# Patient Record
Sex: Female | Born: 1995 | Race: Black or African American | Hispanic: No | Marital: Single | Smoking: Former smoker
Health system: Southern US, Community
[De-identification: ages and names within clinical notes are randomized; demographics above are authoritative.]

## PROBLEM LIST (undated history)

## (undated) DIAGNOSIS — J45909 Unspecified asthma, uncomplicated: Secondary | ICD-10-CM

## (undated) DIAGNOSIS — K2970 Gastritis, unspecified, without bleeding: Secondary | ICD-10-CM

## (undated) DIAGNOSIS — R569 Unspecified convulsions: Secondary | ICD-10-CM

## (undated) DIAGNOSIS — K219 Gastro-esophageal reflux disease without esophagitis: Secondary | ICD-10-CM

## (undated) DIAGNOSIS — J302 Other seasonal allergic rhinitis: Secondary | ICD-10-CM

## (undated) DIAGNOSIS — O24419 Gestational diabetes mellitus in pregnancy, unspecified control: Secondary | ICD-10-CM

## (undated) DIAGNOSIS — D649 Anemia, unspecified: Secondary | ICD-10-CM

## (undated) HISTORY — PX: NO PAST SURGERIES: SHX2092

## (undated) HISTORY — DX: Gastritis, unspecified, without bleeding: K29.70

## (undated) HISTORY — DX: Other seasonal allergic rhinitis: J30.2

## (undated) MED FILL — Iron Sucrose Inj 20 MG/ML (Fe Equiv): INTRAVENOUS | Qty: 10 | Status: AC

---
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
## 2019-10-05 ENCOUNTER — Other Ambulatory Visit: Payer: Self-pay

## 2019-10-05 ENCOUNTER — Emergency Department: Payer: Self-pay

## 2019-10-05 ENCOUNTER — Emergency Department
Admission: EM | Admit: 2019-10-05 | Discharge: 2019-10-05 | Disposition: A | Discharge status: Home / Self Care | Payer: Self-pay | Attending: Student in an Organized Health Care Education/Training Program | Admitting: Student in an Organized Health Care Education/Training Program

## 2019-10-05 DIAGNOSIS — Z79899 Other long term (current) drug therapy: Secondary | ICD-10-CM | POA: Insufficient documentation

## 2019-10-05 DIAGNOSIS — R569 Unspecified convulsions: Secondary | ICD-10-CM | POA: Insufficient documentation

## 2019-10-05 DIAGNOSIS — F17210 Nicotine dependence, cigarettes, uncomplicated: Secondary | ICD-10-CM | POA: Insufficient documentation

## 2019-10-05 DIAGNOSIS — J45909 Unspecified asthma, uncomplicated: Secondary | ICD-10-CM | POA: Insufficient documentation

## 2019-10-05 HISTORY — DX: Unspecified convulsions: R56.9

## 2019-10-05 HISTORY — DX: Unspecified asthma, uncomplicated: J45.909

## 2019-10-05 LAB — CBC WITH DIFFERENTIAL/PLATELET
Abs Immature Granulocytes: 0.03 10*3/uL (ref 0.00–0.07)
Basophils Absolute: 0 10*3/uL (ref 0.0–0.1)
Basophils Relative: 0 %
Eosinophils Absolute: 0 10*3/uL (ref 0.0–0.5)
Eosinophils Relative: 0 %
HCT: 34.8 % — ABNORMAL LOW (ref 36.0–46.0)
Hemoglobin: 10.5 g/dL — ABNORMAL LOW (ref 12.0–15.0)
Immature Granulocytes: 0 %
Lymphocytes Relative: 27 %
Lymphs Abs: 2.6 10*3/uL (ref 0.7–4.0)
MCH: 22.8 pg — ABNORMAL LOW (ref 26.0–34.0)
MCHC: 30.2 g/dL (ref 30.0–36.0)
MCV: 75.5 fL — ABNORMAL LOW (ref 80.0–100.0)
Monocytes Absolute: 0.4 10*3/uL (ref 0.1–1.0)
Monocytes Relative: 4 %
Neutro Abs: 6.6 10*3/uL (ref 1.7–7.7)
Neutrophils Relative %: 69 %
Platelets: 300 10*3/uL (ref 150–400)
RBC: 4.61 MIL/uL (ref 3.87–5.11)
RDW: 15.1 % (ref 11.5–15.5)
WBC: 9.6 10*3/uL (ref 4.0–10.5)
nRBC: 0 % (ref 0.0–0.2)

## 2019-10-05 LAB — URINE DRUG SCREEN, QUALITATIVE (ARMC ONLY)
Amphetamines, Ur Screen: NOT DETECTED
Barbiturates, Ur Screen: NOT DETECTED
Benzodiazepine, Ur Scrn: NOT DETECTED
Cannabinoid 50 Ng, Ur ~~LOC~~: NOT DETECTED
Cocaine Metabolite,Ur ~~LOC~~: NOT DETECTED
MDMA (Ecstasy)Ur Screen: NOT DETECTED
Methadone Scn, Ur: NOT DETECTED
Opiate, Ur Screen: NOT DETECTED
Phencyclidine (PCP) Ur S: NOT DETECTED
Tricyclic, Ur Screen: NOT DETECTED

## 2019-10-05 LAB — COMPREHENSIVE METABOLIC PANEL
ALT: 12 U/L (ref 0–44)
AST: 22 U/L (ref 15–41)
Albumin: 3.6 g/dL (ref 3.5–5.0)
Alkaline Phosphatase: 67 U/L (ref 38–126)
Anion gap: 12 (ref 5–15)
BUN: 7 mg/dL (ref 6–20)
CO2: 20 mmol/L — ABNORMAL LOW (ref 22–32)
Calcium: 8.5 mg/dL — ABNORMAL LOW (ref 8.9–10.3)
Chloride: 104 mmol/L (ref 98–111)
Creatinine, Ser: 0.71 mg/dL (ref 0.44–1.00)
GFR calc Af Amer: >60 mL/min (ref >60)
GFR calc non Af Amer: >60 mL/min (ref >60)
Glucose, Bld: 127 mg/dL — ABNORMAL HIGH (ref 70–99)
Potassium: 3.6 mmol/L (ref 3.5–5.1)
Sodium: 136 mmol/L (ref 135–145)
Total Bilirubin: 0.4 mg/dL (ref 0.3–1.2)
Total Protein: 7.2 g/dL (ref 6.5–8.1)

## 2019-10-05 LAB — POCT PREGNANCY, URINE: Preg Test, Ur: NEGATIVE

## 2019-10-05 MED ORDER — ACETAMINOPHEN 500 MG PO TABS
1000.0000 mg | ORAL_TABLET | Freq: Once | ORAL | Status: AC
  Administered 2019-10-05: 1000 mg via ORAL
  Filled 2019-10-05: qty 2

## 2019-10-05 NOTE — ED Provider Notes (Signed)
[facility] Emergency Department Provider Note    First MD Initiated Contact with Patient 10/05/19 0032     (approximate)  I have reviewed the triage vital signs and the nursing notes.   HISTORY  Chief Complaint Seizures    HPI Carmen Rivers is a 24 y.o. female the time a history of seizure but not on any antiepileptic medications presents to the ER after witnessed seizure episode this evening.  States this occurred while she was asleep.  States the last thing she remembers was "doing it "with her friend in the house.  Denies any chest pain or shortness of breath.  Does have a mild headache.  Not currently on anticoagulation.  Denies any nausea or vomiting.  No blurry vision.  Denies any trauma.    Past Medical History:  Diagnosis Date  . Anemia   . Asthma   . Reactive airway disease   . Seizures (HCC)    No family history on file. Past Surgical History:  Procedure Laterality Date  . NO PAST SURGERIES     Patient Active Problem List   Diagnosis Date Noted  . Anemia 09/30/2015  . Menorrhagia 09/30/2015  . Pulmonary embolism on right (HCC) 09/21/2015  . UTI (lower urinary tract infection) 09/21/2015  . Pulmonary embolus, right (HCC) 09/21/2015      Prior to Admission medications   Medication Sig Start Date End Date Taking? Authorizing Provider  acetaminophen (TYLENOL) 325 MG tablet Take 2 tablets (650 mg total) by mouth every 6 (six) hours as needed for mild pain (or Fever >/= 101). 09/24/15   Gouru, Deanna Artis, MD  albuterol (PROVENTIL HFA;VENTOLIN HFA) 108 (90 Base) MCG/ACT inhaler Inhale 2 puffs into the lungs every 6 (six) hours as needed for wheezing or shortness of breath. 09/24/15   Gouru, Deanna Artis, MD  cephALEXin (KEFLEX) 500 MG capsule Take 1 capsule (500 mg total) by mouth 2 (two) times daily. Patient not taking: Reported on 09/30/2015 09/21/15   Gayla Doss, MD  docusate sodium (COLACE) 100 MG capsule Take 1 capsule (100 mg total) by mouth 2 (two)  times daily. 09/24/15   Ramonita Lab, MD  ferrous sulfate 325 (65 FE) MG tablet Take 1 tablet (325 mg total) by mouth 2 (two) times daily with a meal. 09/24/15   Gouru, Deanna Artis, MD  ibuprofen (ADVIL,MOTRIN) 600 MG tablet Take 1 tablet (600 mg total) by mouth every 6 (six) hours as needed for moderate pain. Patient not taking: Reported on 09/30/2015 09/21/15   Gayla Doss, MD  oxyCODONE (ROXICODONE) 5 MG immediate release tablet Take 1 tablet (5 mg total) by mouth every 6 (six) hours as needed for breakthrough pain. Do not drive while taking this medication. Take this medication only if you're having pain despite taking ibuprofen as prescribed. 09/24/15   Ramonita Lab, MD  Rivaroxaban (XARELTO) 15 MG TABS tablet Take 1 tablet (15 mg total) by mouth 2 (two) times daily with a meal. 09/24/15 10/14/15  Ramonita Lab, MD    Allergies Patient has no known allergies.    Social History Social History   Tobacco Use  . Smoking status: Current Every Day Smoker  . Smokeless tobacco: Never Used  Substance Use Topics  . Alcohol use: No  . Drug use: Yes    Types: Marijuana    Review of Systems Patient denies headaches, rhinorrhea, blurry vision, numbness, shortness of breath, chest pain, edema, cough, abdominal pain, nausea, vomiting, diarrhea, dysuria, fevers, rashes or hallucinations unless otherwise stated  above in HPI. ____________________________________________   PHYSICAL EXAM:  VITAL SIGNS: Vitals:   10/05/19 0600 10/05/19 0630  BP: (!) 106/54 112/61  Pulse: 70 70  Resp: 18 19  Temp:    SpO2: 100% 98%    Constitutional: Alert and oriented.  Eyes: Conjunctivae are normal.  Head: Atraumatic. Nose: No congestion/rhinnorhea. Mouth/Throat: Mucous membranes are moist.   Neck: No stridor. Painless ROM.  Cardiovascular: Normal rate, regular rhythm. Grossly normal heart sounds.  Good peripheral circulation. Respiratory: Normal respiratory effort.  No retractions. Lungs CTAB. Gastrointestinal:  Soft and nontender. No distention. No abdominal bruits. No CVA tenderness. Genitourinary:  Musculoskeletal: No lower extremity tenderness nor edema.  No joint effusions. Neurologic:  Normal speech and language. No gross focal neurologic deficits are appreciated. No facial droop Skin:  Skin is warm, dry and intact. No rash noted. Psychiatric: Mood and affect are normal. Speech and behavior are normal.  ____________________________________________   LABS (all labs ordered are listed, but only abnormal results are displayed)  Results for orders placed or performed during the hospital encounter of 10/05/19 (from the past 24 hour(s))  CBC with Differential/Platelet     Status: Abnormal   Collection Time: 10/05/19 12:47 AM  Result Value Ref Range   WBC 9.6 4.0 - 10.5 K/uL   RBC 4.61 3.87 - 5.11 MIL/uL   Hemoglobin 10.5 (L) 12.0 - 15.0 g/dL   HCT 34.8 (L) 36.0 - 46.0 %   MCV 75.5 (L) 80.0 - 100.0 fL   MCH 22.8 (L) 26.0 - 34.0 pg   MCHC 30.2 30.0 - 36.0 g/dL   RDW 15.1 11.5 - 15.5 %   Platelets 300 150 - 400 K/uL   nRBC 0.0 0.0 - 0.2 %   Neutrophils Relative % 69 %   Neutro Abs 6.6 1.7 - 7.7 K/uL   Lymphocytes Relative 27 %   Lymphs Abs 2.6 0.7 - 4.0 K/uL   Monocytes Relative 4 %   Monocytes Absolute 0.4 0.1 - 1.0 K/uL   Eosinophils Relative 0 %   Eosinophils Absolute 0.0 0.0 - 0.5 K/uL   Basophils Relative 0 %   Basophils Absolute 0.0 0.0 - 0.1 K/uL   Immature Granulocytes 0 %   Abs Immature Granulocytes 0.03 0.00 - 0.07 K/uL  Comprehensive metabolic panel     Status: Abnormal   Collection Time: 10/05/19 12:47 AM  Result Value Ref Range   Sodium 136 135 - 145 mmol/L   Potassium 3.6 3.5 - 5.1 mmol/L   Chloride 104 98 - 111 mmol/L   CO2 20 (L) 22 - 32 mmol/L   Glucose, Bld 127 (H) 70 - 99 mg/dL   BUN 7 6 - 20 mg/dL   Creatinine, Ser 0.71 0.44 - 1.00 mg/dL   Calcium 8.5 (L) 8.9 - 10.3 mg/dL   Total Protein 7.2 6.5 - 8.1 g/dL   Albumin 3.6 3.5 - 5.0 g/dL   AST 22 15 - 41 U/L     ALT 12 0 - 44 U/L   Alkaline Phosphatase 67 38 - 126 U/L   Total Bilirubin 0.4 0.3 - 1.2 mg/dL   GFR calc non Af Amer >60 >60 mL/min   GFR calc Af Amer >60 >60 mL/min   Anion gap 12 5 - 15  Urine Drug Screen, Qualitative ([facility] only)     Status: None   Collection Time: 10/05/19  3:24 AM  Result Value Ref Range   Tricyclic, Ur Screen NONE DETECTED NONE DETECTED   Amphetamines, Ur Screen NONE  DETECTED NONE DETECTED   MDMA (Ecstasy)Ur Screen NONE DETECTED NONE DETECTED   Cocaine Metabolite,Ur [place] NONE DETECTED NONE DETECTED   Opiate, Ur Screen NONE DETECTED NONE DETECTED   Phencyclidine (PCP) Ur S NONE DETECTED NONE DETECTED   Cannabinoid 50 Ng, Ur [place] NONE DETECTED NONE DETECTED   Barbiturates, Ur Screen NONE DETECTED NONE DETECTED   Benzodiazepine, Ur Scrn NONE DETECTED NONE DETECTED   Methadone Scn, Ur NONE DETECTED NONE DETECTED  Pregnancy, urine POC     Status: None   Collection Time: 10/05/19  3:43 AM  Result Value Ref Range   Preg Test, Ur NEGATIVE NEGATIVE   ____________________________________________  EKG My review and personal interpretation at Time: 0:50   Indication: seizure lke activity  Rate: 90  Rhythm: sinus Axis: normal Other: normal intervals, no stemi ____________________________________________  RADIOLOGY  I personally reviewed all radiographic images ordered to evaluate for the above acute complaints and reviewed radiology reports and findings.  These findings were personally discussed with the patient.  Please see medical record for radiology report.  ____________________________________________   PROCEDURES  Procedure(s) performed:  Procedures    Critical Care performed: no ____________________________________________   INITIAL IMPRESSION / ASSESSMENT AND PLAN / ED COURSE  Pertinent labs & imaging results that were available during my care of the patient were reviewed by me and considered in my medical decision making (see chart for details).    DDX: seizure, pseuoseizure, dysrhythmia, electrolyte abn,   Carmen Rivers is a 24 y.o. who presents to the ED with history and presentation as described above arrives to the ER afebrile hemodynamically stable in no acute distress.  Slightly drowsy neuro exam is intact.  Medical be sent for the above differential.  Certainly no evidence of status.  Blood work is reassuring.  No significant acidosis.  No evidence of substance abuse.  CT imaging shows no evidence of acute abnormality.  She is not on anticoagulation at this time.  She was observed in the ER for several hours with no change in mental status and no additional seizure episodes.  No signs of dysrhythmia.  Patient stable and appropriate for outpatient follow-up.     The patient was evaluated in Emergency Department today for the symptoms described in the history of present illness. He/she was evaluated in the context of the global COVID-19 pandemic, which necessitated consideration that the patient might be at risk for infection with the SARS-CoV-2 virus that causes COVID-19. Institutional protocols and algorithms that pertain to the evaluation of patients at risk for COVID-19 are in a state of rapid change based on information released by regulatory bodies including the CDC and federal and state organizations. These policies and algorithms were followed during the patient's care in the ED.  As part of my medical decision making, I reviewed the following data within the electronic MEDICAL RECORD NUMBER Nursing notes reviewed and incorporated, Labs reviewed, notes from prior ED visits and [place] Controlled Substance Database   ____________________________________________   FINAL CLINICAL IMPRESSION(S) / ED DIAGNOSES  Final diagnoses:  Seizure-like activity (HCC)      NEW MEDICATIONS STARTED DURING THIS VISIT:  Discharge Medication List as of 10/05/2019  6:32 AM       Note:  This document was prepared using Dragon voice recognition software  and may include unintentional dictation errors.    Willy Eddy, MD 10/05/19 [phone]

## 2019-10-05 NOTE — ED Triage Notes (Signed)
Pt to ED via EMS from home. Per ems pt had seizure like activity and was post ictal on arrival for EMS. Pt has hx of 1 seizure, pt was prescribed xarelto for 38mos and finished medication 1 yr ago. Pt is now a&o x4. CBG 125. Pt c/o headache

## 2019-10-05 NOTE — Discharge Instructions (Signed)
Do not drive or operate machinery until evaluated and cleared by neurology.

## 2020-07-24 ENCOUNTER — Ambulatory Visit: Payer: Self-pay

## 2020-07-24 ENCOUNTER — Ambulatory Visit: Payer: Self-pay | Admitting: Family Medicine

## 2020-07-24 ENCOUNTER — Encounter: Payer: Self-pay | Admitting: Physician Assistant

## 2020-07-24 ENCOUNTER — Other Ambulatory Visit: Payer: Self-pay

## 2020-07-24 DIAGNOSIS — N760 Acute vaginitis: Secondary | ICD-10-CM

## 2020-07-24 DIAGNOSIS — Z113 Encounter for screening for infections with a predominantly sexual mode of transmission: Secondary | ICD-10-CM

## 2020-07-24 DIAGNOSIS — B9689 Other specified bacterial agents as the cause of diseases classified elsewhere: Secondary | ICD-10-CM

## 2020-07-24 DIAGNOSIS — L739 Follicular disorder, unspecified: Secondary | ICD-10-CM

## 2020-07-24 LAB — WET PREP FOR TRICH, YEAST, CLUE
Trichomonas Exam: POSITIVE — AB
Yeast Exam: NEGATIVE

## 2020-07-24 LAB — HEPATITIS B SURFACE ANTIGEN: Hepatitis B Surface Ag: NONREACTIVE

## 2020-07-24 LAB — PREGNANCY, URINE: Preg Test, Ur: NEGATIVE

## 2020-07-24 MED ORDER — METRONIDAZOLE 500 MG PO TABS: 500.0000 mg | ORAL_TABLET | Freq: Two times a day (BID) | ORAL | 0 refills | Status: AC

## 2020-07-24 NOTE — Progress Notes (Signed)
Reports 2 menses 04/2020 and menses 05/2020 that was normal. Unable to remember if had menses in 06/2020. Per client, is allergic to some trees but does not know which ones. Encouraged to obtain allergies where testing was done. Also thinks may be allergic to almonds, but unsure. Jossie Ng, RN

## 2020-07-24 NOTE — Progress Notes (Signed)
[facility] Department STI clinic/screening visit  Subjective:  Carmen Rivers is a 24 y.o. female being seen today for an STI screening visit. The patient reports they do have symptoms.  Patient reports that they do not desire a pregnancy in the next year.   They reported they are not interested in discussing contraception today.  Patient's last menstrual period was 06/02/2020 (within weeks).   Patient has the following medical conditions:   Patient Active Problem List   Diagnosis Date Noted  . Anemia 09/30/2015  . Menorrhagia 09/30/2015  . Pulmonary embolism on right (HCC) 09/21/2015  . UTI (lower urinary tract infection) 09/21/2015  . Pulmonary embolus, right (HCC) 09/21/2015    Chief Complaint  Patient presents with  . SEXUALLY TRANSMITTED DISEASE    HPI  Patient reports having infected hair bumps   Last HIV test per patient was 2 months ago  Patient reports last pap was 2 months ago  See flowsheet for further details and programmatic requirements.    The following portions of the patient's history were reviewed and updated as appropriate: allergies, current medications, past medical history, past social history, past surgical history and problem list.  Objective:  There were no vitals filed for this visit.  Physical Exam Vitals and nursing note reviewed.  Constitutional:      Appearance: Normal appearance.  HENT:     Head: Normocephalic and atraumatic.     Mouth/Throat:     Mouth: Mucous membranes are moist.     Pharynx: Oropharynx is clear. No oropharyngeal exudate or posterior oropharyngeal erythema.  Pulmonary:     Effort: Pulmonary effort is normal.  Chest:  Breasts:     Right: No axillary adenopathy or supraclavicular adenopathy.     Left: No axillary adenopathy or supraclavicular adenopathy.    Abdominal:     General: Abdomen is flat.     Palpations: There is no mass.     Tenderness: There is no abdominal tenderness. There is no rebound.   Genitourinary:    General: Normal vulva.     Exam position: Lithotomy position.     Pubic Area: No rash or pubic lice.      Labia:        Right: No rash or lesion.        Left: No rash or lesion.      Vagina: Normal. No vaginal discharge, erythema, bleeding or lesions.     Cervix: No cervical motion tenderness, discharge, friability, lesion or erythema.     Uterus: Normal.      Adnexa: Right adnexa normal and left adnexa normal.     Rectum: Normal.     Comments: External genitalia without, lice, nits, erythema, edema , lesions or inguinal adenopathy. Hair shaven with multiple raised papules present. Not inflamed or open.     Vagina with normal mucosa and  Yellow- tan, frothy discharge and pH >4.  Cervix without visual lesions, uterus firm, mobile, non-tender, no masses, CMT adnexal fullness or tenderness.  Musculoskeletal:     Cervical back: Normal range of motion and neck supple.  Lymphadenopathy:     Head:     Right side of head: No preauricular or posterior auricular adenopathy.     Left side of head: No preauricular or posterior auricular adenopathy.     Cervical: No cervical adenopathy.     Upper Body:     Right upper body: No supraclavicular or axillary adenopathy.     Left upper body: No supraclavicular or  axillary adenopathy.     Lower Body: No right inguinal adenopathy. No left inguinal adenopathy.  Skin:    General: Skin is warm and dry.     Findings: No rash.  Neurological:     Mental Status: She is alert and oriented to person, place, and time.  Psychiatric:        Mood and Affect: Mood normal.        Behavior: Behavior normal.      Assessment and Plan:  Carmen Rivers is a 24 y.o. female presenting to the [facility] Department for STI screening  1. Screening examination for venereal disease  Patient in clinic for STI screening.  Patient reports having inflamed hair bumps .  Patient shaves and waxes often. Denies any other symptoms.     Patient had  not had a menses since early 05/2020.  Does not think she is pregnant, she is not on birth control.     - Chlamydia/Gonorrhea [place] [facility] - HIV/HCV [place] [facility] - HBV Antigen/Antibody [place] [facility] - Syphilis Serology, [place] [facility] - WET PREP FOR TRICH, YEAST, CLUE - Pregnancy, urine - metroNIDAZOLE (FLAGYL) 500 MG tablet; Take 1 tablet (500 mg total) by mouth 2 (two) times daily for 7 days.  Dispense: 14 tablet; Refill: 0  2. BV (bacterial vaginosis) Wet prep results positive for BV and TRICH.     Patient treated with:  - metroNIDAZOLE (FLAGYL) 500 MG tablet; Take 1 tablet (500 mg total) by mouth 2 (two) times daily for 7 days.  Dispense: 14 tablet; Refill: 0  3.  Folliculitis   Discusses with patient methods to reduce folliculitis, such as warm compress before shaving, shaving with single blade razor and using a sensitive shaving cream.     No follow-ups on file.  No future appointments.  Wendi Snipes, FNP

## 2020-07-24 NOTE — Progress Notes (Signed)
Wet mount reviewed with provider. Patient tx'd for BV/Trich per SO. Co to use single blade razor and lather shaving area well before shaving per Elveria Rising FNP Richmond Campbell, RN

## 2020-07-31 LAB — HM HEPATITIS C SCREENING LAB: HM Hepatitis Screen: NEGATIVE

## 2020-07-31 LAB — HM HIV SCREENING LAB: HM HIV Screening: NEGATIVE

## 2020-08-03 ENCOUNTER — Encounter: Payer: Self-pay | Admitting: Student

## 2020-08-06 LAB — HEPATITIS B SURFACE ANTIGEN

## 2020-08-07 ENCOUNTER — Ambulatory Visit: Payer: Self-pay | Admitting: Advanced Practice Midwife

## 2020-08-07 ENCOUNTER — Encounter: Payer: Self-pay | Admitting: Advanced Practice Midwife

## 2020-08-07 ENCOUNTER — Other Ambulatory Visit: Payer: Self-pay

## 2020-08-07 DIAGNOSIS — B3731 Acute candidiasis of vulva and vagina: Secondary | ICD-10-CM

## 2020-08-07 DIAGNOSIS — L309 Dermatitis, unspecified: Secondary | ICD-10-CM | POA: Insufficient documentation

## 2020-08-07 DIAGNOSIS — J45909 Unspecified asthma, uncomplicated: Secondary | ICD-10-CM | POA: Insufficient documentation

## 2020-08-07 DIAGNOSIS — F17200 Nicotine dependence, unspecified, uncomplicated: Secondary | ICD-10-CM | POA: Insufficient documentation

## 2020-08-07 DIAGNOSIS — Z113 Encounter for screening for infections with a predominantly sexual mode of transmission: Secondary | ICD-10-CM

## 2020-08-07 DIAGNOSIS — B373 Candidiasis of vulva and vagina: Secondary | ICD-10-CM

## 2020-08-07 LAB — WET PREP FOR TRICH, YEAST, CLUE: Trichomonas Exam: NEGATIVE

## 2020-08-07 MED ORDER — CLOTRIMAZOLE 1 % VA CREA: 1.0000 | TOPICAL_CREAM | Freq: Every day | VAGINAL | 0 refills | Status: DC

## 2020-08-07 NOTE — Progress Notes (Deleted)
[facility] Department STI clinic/screening visit  Subjective:  Carmen Rivers is a 25 y.o.SBF nullip smoker female being seen today for an STI screening visit. The patient reports they do have symptoms.  Patient reports that they do not desire a pregnancy in the next year.   They reported they are not interested in discussing contraception today.  Patient's last menstrual period was 07/25/2020.   Patient has the following medical conditions:   Patient Active Problem List   Diagnosis Date Noted  . Morbid obesity (HCC) 220 lbs 08/07/2020  . Eczema 08/07/2020  . Asthma 08/07/2020  . Smoker cigars 08/07/2020  . Anemia 09/30/2015  . Menorrhagia 09/30/2015  . Pulmonary embolism on right (HCC) 09/21/2015  . UTI (lower urinary tract infection) 09/21/2015  . Pulmonary embolus, right (HCC) 09/21/2015    No chief complaint on file.   HPI  Patient reports internal and external itching x2 days after taking antibiotic for trich and BV with last dose 12/32/21.  Last MJ 07/19/20.  Last ETOH 07/19/20 (1 glass champagne) 1x/mo.  Last vaped 06/2020.  Current smoker of cigars.  LMP 07/25/20.  Last sex 07/19/20 with condom; with current partner x 1 mo. 1 partner in last 3 mo.  Last HIV test per patient/review of record was 07/31/20. Patient reports last pap was maybe 06/09/20 neg.  See flowsheet for further details and programmatic requirements.    The following portions of the patient's history were reviewed and updated as appropriate: allergies, current medications, past medical history, past social history, past surgical history and problem list.  Objective:  There were no vitals filed for this visit.  Physical Exam Vitals and nursing note reviewed.  Constitutional:      Appearance: Normal appearance. She is obese.  HENT:     Head: Normocephalic and atraumatic.     Mouth/Throat:     Mouth: Mucous membranes are moist.     Pharynx: Oropharynx is clear. No oropharyngeal exudate or  posterior oropharyngeal erythema.  Eyes:     Conjunctiva/sclera: Conjunctivae normal.  Pulmonary:     Effort: Pulmonary effort is normal.  Chest:  Breasts:     Right: No axillary adenopathy or supraclavicular adenopathy.     Left: No axillary adenopathy or supraclavicular adenopathy.    Abdominal:     Palpations: Abdomen is soft. There is no mass.     Tenderness: There is no abdominal tenderness. There is no rebound.     Comments: Soft without masses or tenderness, poor tone, increased adipose  Genitourinary:    General: Normal vulva.     Exam position: Lithotomy position.     Pubic Area: No rash or pubic lice.      Labia:        Right: No rash or lesion.        Left: No rash or lesion.      Vagina: Normal. No vaginal discharge (erythematous with adherrent white thick leukorrhea, ph<4.5), erythema, bleeding or lesions.     Cervix: Normal.     Uterus: Normal.      Adnexa: Right adnexa normal and left adnexa normal.     Rectum: Normal.  Lymphadenopathy:     Head:     Right side of head: No preauricular or posterior auricular adenopathy.     Left side of head: No preauricular or posterior auricular adenopathy.     Cervical: No cervical adenopathy.     Upper Body:     Right upper body: No supraclavicular or axillary  adenopathy.     Left upper body: No supraclavicular or axillary adenopathy.     Lower Body: No right inguinal adenopathy. No left inguinal adenopathy.  Skin:    General: Skin is warm and dry.     Findings: No rash.  Neurological:     Mental Status: She is alert and oriented to person, place, and time.      Assessment and Plan:  Carmen Rivers is a 25 y.o. female presenting to the [facility] Department for STI screening  1. Morbid obesity (HCC)   2. Screening examination for venereal disease Treat wet mount for yeast Immunization nurse consult Counseled to stop smoking - WET PREP FOR TRICH, YEAST, CLUE - Chlamydia/Gonorrhea [place] [facility]  3.  Eczema, unspecified type   4. Uncomplicated asthma, unspecified asthma severity, unspecified whether persistent      Return if symptoms worsen or fail to improve.  No future appointments.  Carmen Rivers, CNM

## 2020-08-07 NOTE — Progress Notes (Addendum)
[facility] Department STI clinic/screening visit  Subjective:  Carmen Rivers is a 25 y.o.SBF nullip smoker female being seen today for an STI screening visit. The patient reports they do have symptoms.  Patient reports that they do not desire a pregnancy in the next year.   They reported they are not interested in discussing contraception today.  Patient's last menstrual period was 07/25/2020.   Patient has the following medical conditions:   Patient Active Problem List   Diagnosis Date Noted  . Morbid obesity (HCC) 220 lbs 08/07/2020  . Eczema 08/07/2020  . Asthma 08/07/2020  . Anemia 09/30/2015  . Menorrhagia 09/30/2015  . Pulmonary embolism on right (HCC) 09/21/2015  . UTI (lower urinary tract infection) 09/21/2015  . Pulmonary embolus, right (HCC) 09/21/2015    No chief complaint on file.   HPI  Patient reports internal and external itching x2 days after taking antibiotic for trich and BV with last dose 12/32/21.  Last MJ 07/19/20.  Last ETOH 07/19/20 (1 glass champagne) 1x/mo.  Last vaped 06/2020.  Current smoker of cigars.  LMP 07/25/20.  Last sex 07/19/20 with condom; with current partner x 1 mo. 1 partner in last 3 mo.  Last HIV test per patient/review of record was 07/31/20. Patient reports last pap was maybe 06/09/20 neg.  See flowsheet for further details and programmatic requirements.    The following portions of the patient's history were reviewed and updated as appropriate: allergies, current medications, past medical history, past social history, past surgical history and problem list.  Objective:  There were no vitals filed for this visit.  Physical Exam Vitals and nursing note reviewed.  Constitutional:      Appearance: Normal appearance. She is obese.  HENT:     Head: Normocephalic and atraumatic.     Mouth/Throat:     Mouth: Mucous membranes are moist.     Pharynx: Oropharynx is clear. No oropharyngeal exudate or posterior oropharyngeal  erythema.  Eyes:     Conjunctiva/sclera: Conjunctivae normal.  Pulmonary:     Effort: Pulmonary effort is normal.  Chest:  Breasts:     Right: No axillary adenopathy or supraclavicular adenopathy.     Left: No axillary adenopathy or supraclavicular adenopathy.    Abdominal:     Palpations: Abdomen is soft. There is no mass.     Tenderness: There is no abdominal tenderness. There is no rebound.     Comments: Soft without masses or tenderness, poor tone, increased adipose  Genitourinary:    General: Normal vulva.     Exam position: Lithotomy position.     Pubic Area: No rash or pubic lice.      Labia:        Right: No rash or lesion.        Left: No rash or lesion.      Vagina: Normal. No vaginal discharge (erythematous with adherrent white thick leukorrhea, ph<4.5), erythema, bleeding or lesions.     Cervix: Normal.     Uterus: Normal.      Adnexa: Right adnexa normal and left adnexa normal.     Rectum: Normal.  Lymphadenopathy:     Head:     Right side of head: No preauricular or posterior auricular adenopathy.     Left side of head: No preauricular or posterior auricular adenopathy.     Cervical: No cervical adenopathy.     Upper Body:     Right upper body: No supraclavicular or axillary adenopathy.  Left upper body: No supraclavicular or axillary adenopathy.     Lower Body: No right inguinal adenopathy. No left inguinal adenopathy.  Skin:    General: Skin is warm and dry.     Findings: No rash.  Neurological:     Mental Status: She is alert and oriented to person, place, and time.      Assessment and Plan:  Carmen Rivers is a 25 y.o. female presenting to the [facility] Department for STI screening  1. Morbid obesity (HCC)   2. Screening examination for venereal disease Treat wet mount for yeast Immunization nurse consult Counseled to stop smoking - WET PREP FOR TRICH, YEAST, CLUE - Chlamydia/Gonorrhea [place] [facility]  3. Eczema, unspecified  type   4. Uncomplicated asthma, unspecified asthma severity, unspecified whether persistent      No follow-ups on file.  No future appointments.  Alberteen Spindle, CNM

## 2020-08-07 NOTE — Progress Notes (Signed)
Patient into clinic with symptoms.  Wet mount reviewed with patient and patient treated for yeast with Clotrimazole vaginal cream per standing order.

## 2020-10-14 NOTE — Progress Notes (Signed)
RN abstracted hep B lab result. Harvie Heck, RN

## 2020-11-24 ENCOUNTER — Ambulatory Visit: Payer: Self-pay

## 2020-12-26 ENCOUNTER — Encounter: Payer: Self-pay | Admitting: Emergency Medicine

## 2020-12-26 ENCOUNTER — Other Ambulatory Visit: Payer: Self-pay

## 2020-12-26 DIAGNOSIS — J45909 Unspecified asthma, uncomplicated: Secondary | ICD-10-CM | POA: Insufficient documentation

## 2020-12-26 DIAGNOSIS — F17210 Nicotine dependence, cigarettes, uncomplicated: Secondary | ICD-10-CM | POA: Insufficient documentation

## 2020-12-26 DIAGNOSIS — Y92511 Restaurant or cafe as the place of occurrence of the external cause: Secondary | ICD-10-CM | POA: Diagnosis not present

## 2020-12-26 DIAGNOSIS — Y990 Civilian activity done for income or pay: Secondary | ICD-10-CM | POA: Diagnosis not present

## 2020-12-26 DIAGNOSIS — T23061A Burn of unspecified degree of back of right hand, initial encounter: Secondary | ICD-10-CM | POA: Diagnosis present

## 2020-12-26 DIAGNOSIS — X102XXA Contact with fats and cooking oils, initial encounter: Secondary | ICD-10-CM | POA: Diagnosis not present

## 2020-12-26 DIAGNOSIS — T23162A Burn of first degree of back of left hand, initial encounter: Secondary | ICD-10-CM | POA: Insufficient documentation

## 2020-12-26 NOTE — ED Triage Notes (Signed)
Pt was at work and had hot grease got on her right hand. It is red without blister or open skin.

## 2020-12-27 ENCOUNTER — Emergency Department
Admission: EM | Admit: 2020-12-27 | Discharge: 2020-12-27 | Disposition: A | Discharge status: Home / Self Care | Payer: No Typology Code available for payment source | Attending: Emergency Medicine | Admitting: Emergency Medicine

## 2020-12-27 DIAGNOSIS — T23161A Burn of first degree of back of right hand, initial encounter: Secondary | ICD-10-CM

## 2020-12-27 MED ORDER — IBUPROFEN 800 MG PO TABS
800.0000 mg | ORAL_TABLET | Freq: Once | ORAL | Status: AC
  Administered 2020-12-27: 800 mg via ORAL
  Filled 2020-12-27: qty 1

## 2020-12-27 MED ORDER — SILVER SULFADIAZINE 1 % EX CREA: TOPICAL_CREAM | CUTANEOUS | 1 refills | Status: AC

## 2020-12-27 MED ORDER — IBUPROFEN 800 MG PO TABS: 800.0000 mg | ORAL_TABLET | Freq: Three times a day (TID) | ORAL | 0 refills | Status: DC | PRN

## 2020-12-27 NOTE — Discharge Instructions (Signed)
You may alternate Tylenol 1000 mg every 6 hours as needed for pain, fever and Ibuprofen 800 mg every 8 hours as needed for pain, fever.  Please take Ibuprofen with food.  Do not take more than 4000 mg of Tylenol (acetaminophen) in a 24 hour period.  You may apply ice as needed for pain and swelling.
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
## 2021-12-28 IMAGING — CT CT HEAD W/O CM
3 series · 16 of 46 positions shown, 19 images · non-contrast
Comparison: None.

CLINICAL DATA: 24-year-old female with seizure like activity,
postictal.

EXAM:
CT HEAD WITHOUT CONTRAST
TECHNIQUE: Contiguous axial images were obtained from the base of the skull
through the vertex without intravenous contrast.

[Series 2: head wo · axial · 0.42mm/px · z∈[-112,+8]mm · 10 of 29 slices shown, 13 images]
[im 3/29  brain]
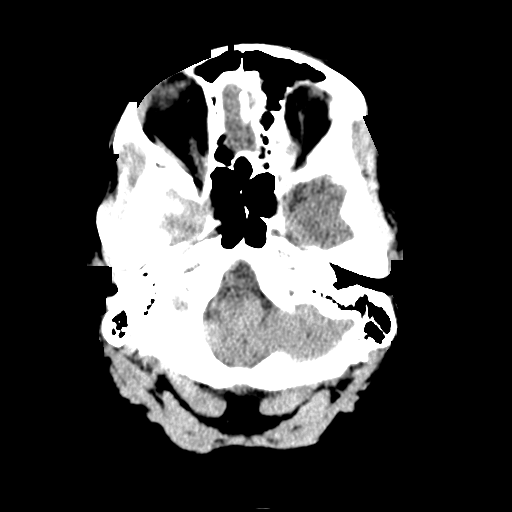
[im 3/29  bone]
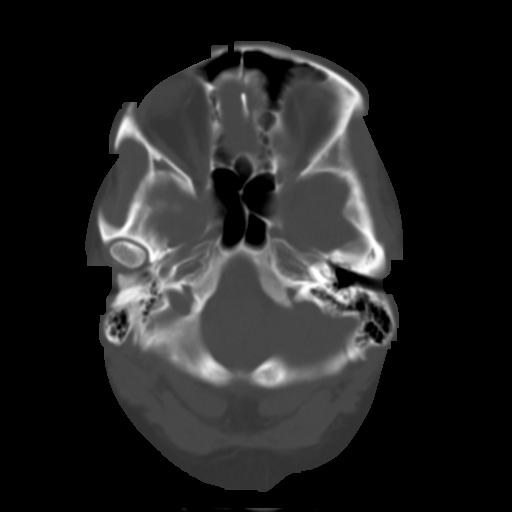
[im 6/29  brain]
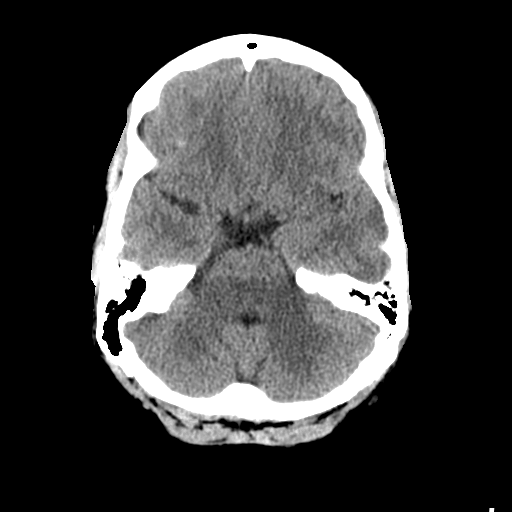
[im 8/29  brain]
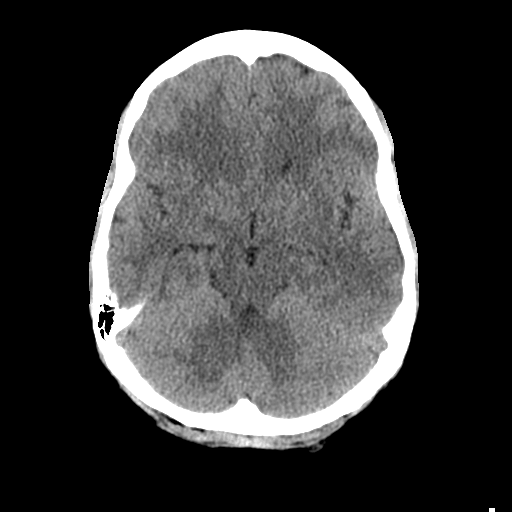
[im 11/29  brain]
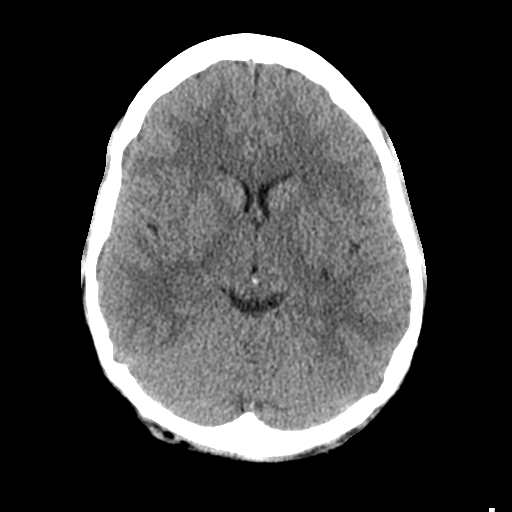
[im 14/29  brain]
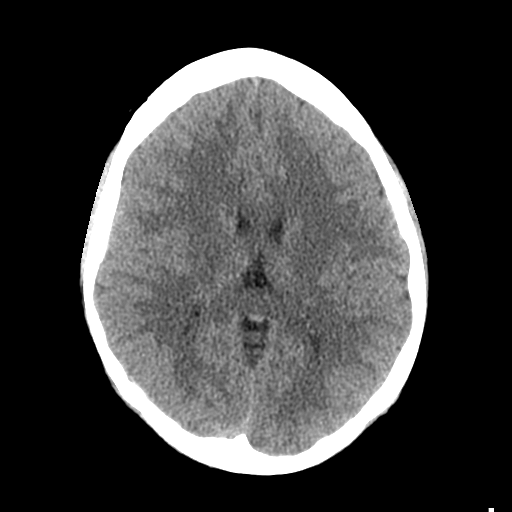
[im 14/29  bone]
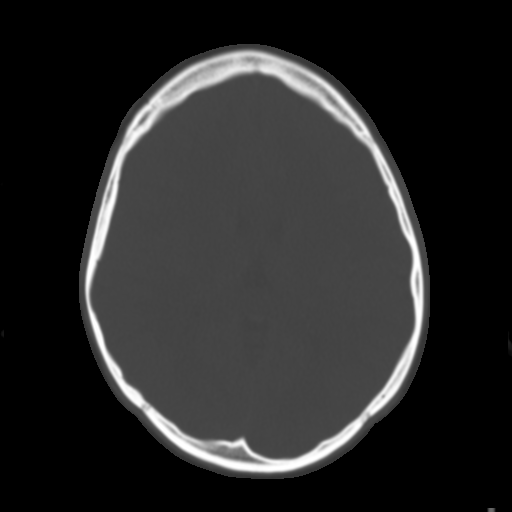
[im 16/29  brain]
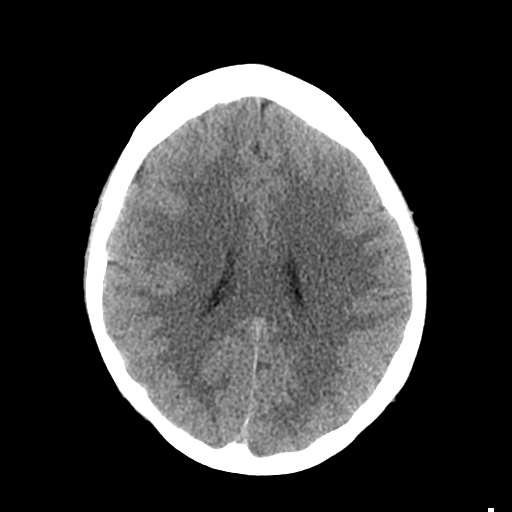
[im 19/29  brain]
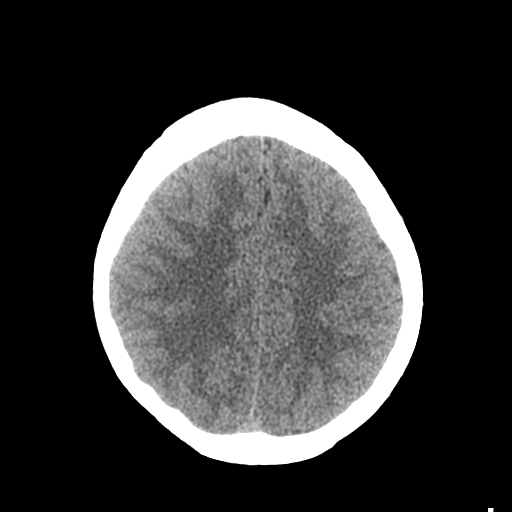
[im 22/29  brain]
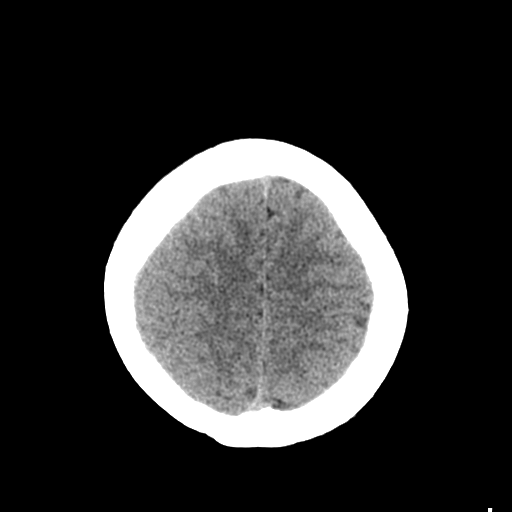
[im 24/29  brain]
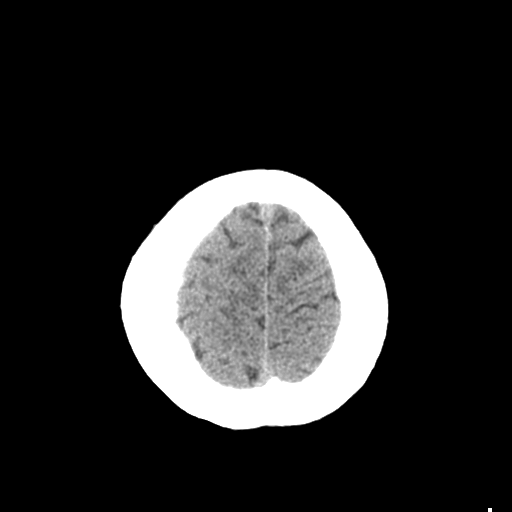
[im 24/29  bone]
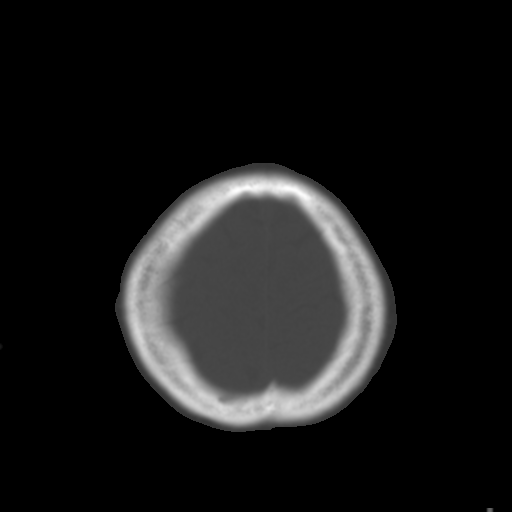
[im 27/29  brain]
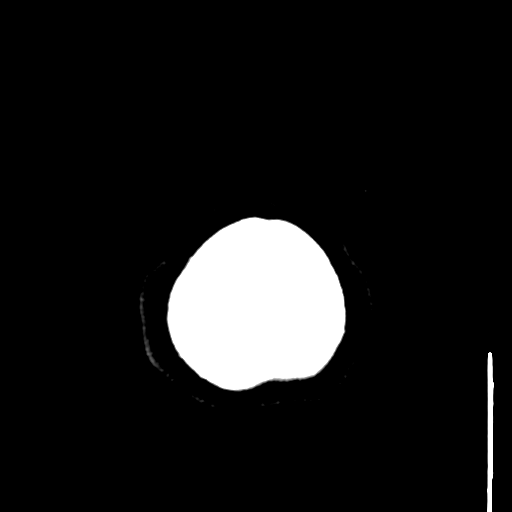

[Series 4: coronal soft tissue · coronal · 0.32mm/px · 3 of 63 slices shown]
[im 21/63  brain]
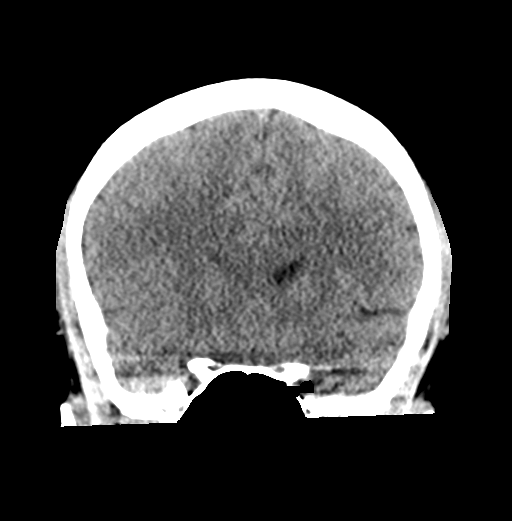
[im 28/63  brain]
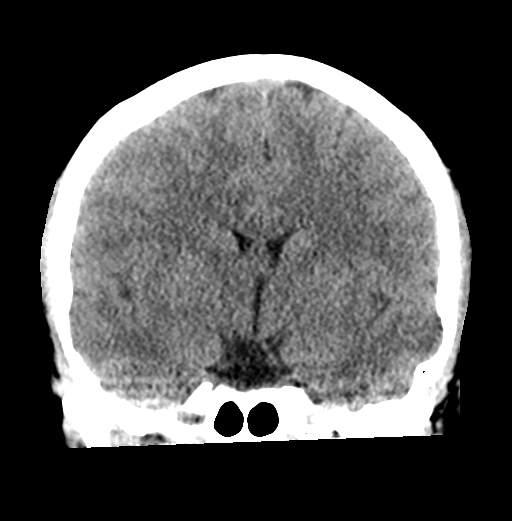
[im 35/63  brain]
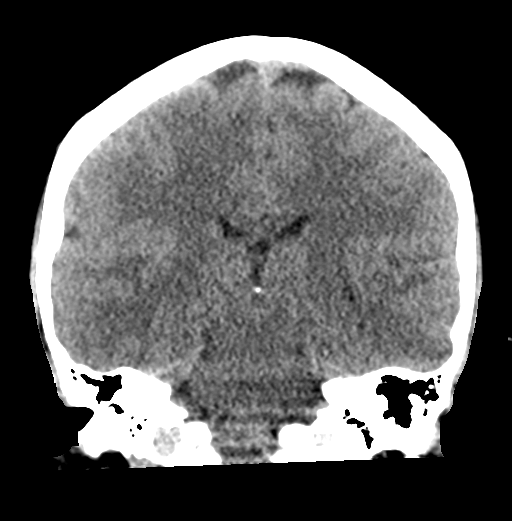

[Series 5: sagittal soft tissue · sagittal · 0.33mm/px · 3 of 52 slices shown]
[im 18/52  brain]
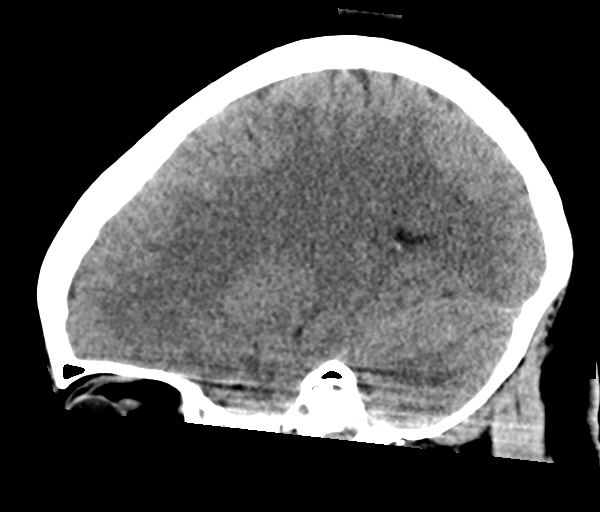
[im 26/52  brain]
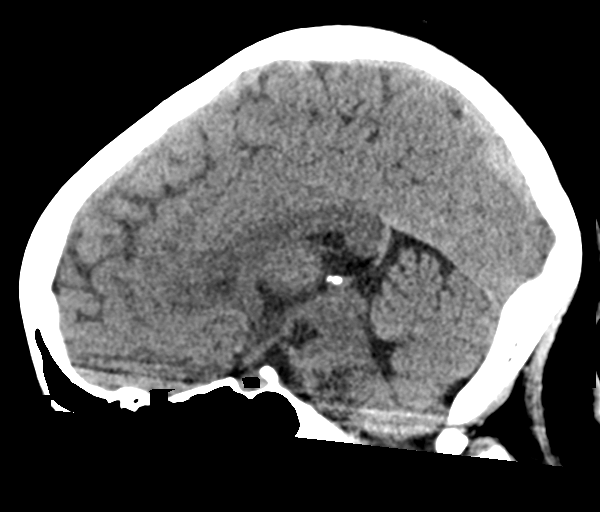
[im 35/52  brain]
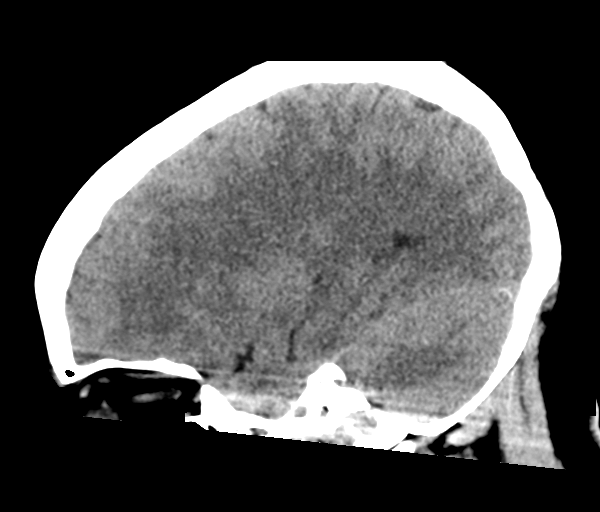

[16 of 46 positions shown; findings below may reference images not displayed]

FINDINGS: Brain: Normal cerebral volume. No midline shift, ventriculomegaly,
mass effect, evidence of mass lesion, intracranial hemorrhage or
evidence of cortically based acute infarction. Gray-white matter
differentiation is within normal limits throughout the brain.

Vascular: No suspicious intracranial vascular hyperdensity.

Skull: Negative.

Sinuses/Orbits: Visualized paranasal sinuses and mastoids are clear.

Other: Visualized orbits and scalp soft tissues are within normal
limits.
IMPRESSION: Negative head CT.  Normal noncontrast CT appearance of the brain.

## 2022-04-26 ENCOUNTER — Inpatient Hospital Stay: Payer: BC Managed Care – PPO

## 2022-04-26 ENCOUNTER — Encounter: Payer: Self-pay | Admitting: Oncology

## 2022-04-26 ENCOUNTER — Inpatient Hospital Stay: Payer: BC Managed Care – PPO | Attending: Oncology | Admitting: Oncology

## 2022-04-26 VITALS — BP 113/64 | HR 77 | Temp 99.0°F | Resp 16 | Ht 63.0 in | Wt 230.0 lb

## 2022-04-26 DIAGNOSIS — D500 Iron deficiency anemia secondary to blood loss (chronic): Secondary | ICD-10-CM | POA: Insufficient documentation

## 2022-04-26 DIAGNOSIS — N920 Excessive and frequent menstruation with regular cycle: Secondary | ICD-10-CM | POA: Diagnosis present

## 2022-04-26 DIAGNOSIS — D509 Iron deficiency anemia, unspecified: Secondary | ICD-10-CM | POA: Insufficient documentation

## 2022-04-26 LAB — COMPREHENSIVE METABOLIC PANEL
ALT: 11 U/L (ref 0–44)
AST: 16 U/L (ref 15–41)
Albumin: 4 g/dL (ref 3.5–5.0)
Alkaline Phosphatase: 75 U/L (ref 38–126)
Anion gap: 4 — ABNORMAL LOW (ref 5–15)
BUN: 6 mg/dL (ref 6–20)
CO2: 26 mmol/L (ref 22–32)
Calcium: 9 mg/dL (ref 8.9–10.3)
Chloride: 105 mmol/L (ref 98–111)
Creatinine, Ser: 0.63 mg/dL (ref 0.44–1.00)
GFR, Estimated: >60 mL/min (ref >60)
Glucose, Bld: 104 mg/dL — ABNORMAL HIGH (ref 70–99)
Potassium: 3.6 mmol/L (ref 3.5–5.1)
Sodium: 135 mmol/L (ref 135–145)
Total Bilirubin: 0.4 mg/dL (ref 0.3–1.2)
Total Protein: 8.5 g/dL — ABNORMAL HIGH (ref 6.5–8.1)

## 2022-04-26 LAB — IRON AND TIBC
Iron: 16 ug/dL — ABNORMAL LOW (ref 28–170)
Saturation Ratios: 3 % — ABNORMAL LOW (ref 10.4–31.8)
TIBC: 540 ug/dL — ABNORMAL HIGH (ref 250–450)
UIBC: 524 ug/dL

## 2022-04-26 LAB — CBC WITH DIFFERENTIAL/PLATELET
Abs Immature Granulocytes: 0.02 10*3/uL (ref 0.00–0.07)
Basophils Absolute: 0 10*3/uL (ref 0.0–0.1)
Basophils Relative: 0 %
Eosinophils Absolute: 0 10*3/uL (ref 0.0–0.5)
Eosinophils Relative: 0 %
HCT: 32.7 % — ABNORMAL LOW (ref 36.0–46.0)
Hemoglobin: 9.7 g/dL — ABNORMAL LOW (ref 12.0–15.0)
Immature Granulocytes: 0 %
Lymphocytes Relative: 38 %
Lymphs Abs: 3.5 10*3/uL (ref 0.7–4.0)
MCH: 18.2 pg — ABNORMAL LOW (ref 26.0–34.0)
MCHC: 29.7 g/dL — ABNORMAL LOW (ref 30.0–36.0)
MCV: 61.4 fL — ABNORMAL LOW (ref 80.0–100.0)
Monocytes Absolute: 0.4 10*3/uL (ref 0.1–1.0)
Monocytes Relative: 5 %
Neutro Abs: 5.3 10*3/uL (ref 1.7–7.7)
Neutrophils Relative %: 57 %
Platelets: 411 10*3/uL — ABNORMAL HIGH (ref 150–400)
RBC: 5.33 MIL/uL — ABNORMAL HIGH (ref 3.87–5.11)
RDW: 20.8 % — ABNORMAL HIGH (ref 11.5–15.5)
WBC: 9.3 10*3/uL (ref 4.0–10.5)
nRBC: 0 % (ref 0.0–0.2)

## 2022-04-26 LAB — RETIC PANEL
Immature Retic Fract: 22 % — ABNORMAL HIGH (ref 2.3–15.9)
RBC.: 5.25 MIL/uL — ABNORMAL HIGH (ref 3.87–5.11)
Retic Count, Absolute: 47.2 10*3/uL (ref 19.0–186.0)
Retic Ct Pct: 0.9 % (ref 0.4–3.1)
Reticulocyte Hemoglobin: 18.7 pg — ABNORMAL LOW (ref >27.9)

## 2022-04-26 LAB — FERRITIN: Ferritin: 2 ng/mL — ABNORMAL LOW (ref 11–307)

## 2022-04-26 NOTE — Progress Notes (Signed)
Hematology/Oncology Consult note [phone] [phone]      Patient Care Team: Center, [facility] as PCP - General (General Practice)   REFERRING PROVIDER: Oswaldo Conroy, MD  CHIEF COMPLAINTS/REASON FOR VISIT:  Anemia  ASSESSMENT & PLAN:  IDA (iron deficiency anemia) I discussed about the potential risks including but not limited to allergic reactions/infusion reactions including anaphylactic reactions, phlebitis, high blood pressure, wheezing, SOB, skin rash, weight gain, leg swelling, headache, nausea and fatigue, etc. Patient tolerates oral iron supplement poorly and desires to achieved higher level of iron faster for adequate hematopoesis. Plan IV venofer weekly x 5 Recommend pregnancy testing prior to Venofer   Menorrhagia Recommend patient to continue follow up with gyn  Orders Placed This Encounter  Procedures   Ferritin    Standing Status:   Future    Number of Occurrences:   1    Standing Expiration Date:   10/26/2022   CBC with Differential/Platelet    Standing Status:   Future    Number of Occurrences:   1    Standing Expiration Date:   04/27/2023   Retic Panel    Standing Status:   Future    Number of Occurrences:   1    Standing Expiration Date:   04/27/2023   Iron and TIBC    Standing Status:   Future    Number of Occurrences:   1    Standing Expiration Date:   04/27/2023   Comprehensive metabolic panel    Standing Status:   Future    Number of Occurrences:   1    Standing Expiration Date:   04/27/2023   Follow up in 4 months.  All questions were answered. The patient knows to call the clinic with any problems, questions or concerns.  Rickard Patience, MD, PhD [facility] Hematology Oncology 04/26/2022     HISTORY OF PRESENTING ILLNESS:  Carmen Rivers is a  26 y.o.  female with PMH listed below who was referred to me for anemia Reviewed patient's recent labs that was done.  She was found to have abnormal CBC  with Hb of 9.5, microcytic.  + heavy menstrual period, + fatigue.  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  She denies over the counter NSAID ingestion.  She has tried different types of oral iron supplementation and she is not able to tolerate due to nausea.     MEDICAL HISTORY:  Past Medical History:  Diagnosis Date   Anemia    Asthma    Gastritis    Reactive airway disease    Seasonal allergies    Seizures (HCC)     SURGICAL HISTORY: Past Surgical History:  Procedure Laterality Date   NO PAST SURGERIES      SOCIAL HISTORY: Social History   Socioeconomic History   Marital status: Single    Spouse name: Not on file   Number of children: Not on file   Years of education: Not on file   Highest education level: Not on file  Occupational History   Not on file  Tobacco Use   Smoking status: Every Day    Types: E-cigarettes   Smokeless tobacco: Never  Substance and Sexual Activity   Alcohol use: Yes    Alcohol/week: 1.0 standard drink of alcohol  Types: 1 Standard drinks or equivalent per week    Comment: Last ETOH use 07/19/20   Drug use: Yes    Types: Marijuana    Comment: Last marijuana use Christmas 07/19/20.   Sexual activity: Yes    Partners: Male    Birth control/protection: Condom  Other Topics Concern   Not on file  Social History Narrative   Not on file   Social Determinants of Health   Financial Resource Strain: Not on file  Food Insecurity: Not on file  Transportation Needs: Not on file  Physical Activity: Not on file  Stress: Not on file  Social Connections: Not on file  Intimate Partner Violence: Not on file    FAMILY HISTORY: Family History  Problem Relation Age of Onset   Cancer Maternal Grandmother        Ovarian cancer     ALLERGIES:  is allergic to estrogens.  MEDICATIONS:  Current Outpatient Medications  Medication Sig Dispense Refill   albuterol (PROVENTIL HFA;VENTOLIN HFA) 108 (90 Base) MCG/ACT inhaler Inhale 2 puffs into the lungs every 6 (six) hours as needed for wheezing or shortness of breath. 1 Inhaler 0   BAC 50-325-40 MG tablet Take 1-2 tablets by mouth every 4 (four) hours as needed.     DULERA 50-5 MCG/ACT AERO Inhale into the lungs.     montelukast (SINGULAIR) 10 MG tablet Take 10 mg by mouth daily.     omeprazole (PRILOSEC) 20 MG capsule Take 20 mg by mouth daily.     No current facility-administered medications for this visit.    Review of Systems  Constitutional:  Positive for fatigue. Negative for appetite change, chills and fever.  HENT:   Negative for hearing loss and voice change.   Eyes:  Negative for eye problems.  Respiratory:  Negative for chest tightness and cough.   Cardiovascular:  Negative for chest pain.  Gastrointestinal:  Negative for abdominal distention, abdominal pain and blood in stool.  Endocrine: Negative for hot flashes.  Genitourinary:  Positive for menstrual problem. Negative for difficulty urinating and frequency.   Musculoskeletal:  Negative for arthralgias.  Skin:  Negative for itching and rash.  Neurological:  Negative for extremity weakness.  Hematological:  Negative for adenopathy.  Psychiatric/Behavioral:  Negative for confusion.     PHYSICAL EXAMINATION: ECOG PERFORMANCE STATUS: 1 - Symptomatic but completely ambulatory Vitals:   04/26/22 1504  BP: 113/64  Pulse: 77  Resp: 16  Temp: 99 F (37.2 C)  SpO2: 100%   Filed Weights   04/26/22 1504  Weight: 230 lb (104.3 kg)    Physical Exam Constitutional:      General: She is not in acute distress. HENT:     Head: Normocephalic and atraumatic.  Eyes:     General: No scleral icterus. Cardiovascular:     Rate and Rhythm: Normal rate and regular rhythm.     Heart sounds: Normal heart  sounds.  Pulmonary:     Effort: Pulmonary effort is normal. No respiratory distress.     Breath sounds: No wheezing.  Abdominal:     General: Bowel sounds are normal. There is no distension.     Palpations: Abdomen is soft.  Musculoskeletal:        General: No deformity. Normal range of motion.     Cervical back: Normal range of motion and neck supple.  Skin:    General: Skin is warm and dry.     Findings: No erythema or rash.  Neurological:  Mental Status: She is alert and oriented to person, place, and time. Mental status is at baseline.     Cranial Nerves: No cranial nerve deficit.     Coordination: Coordination normal.  Psychiatric:        Mood and Affect: Mood normal.      LABORATORY DATA:  I have reviewed the data as listed    Latest Ref Rng & Units 04/26/2022    3:44 PM 10/05/2019   12:47 AM 09/30/2015    5:01 PM  CBC  WBC 4.0 - 10.5 K/uL 9.3  9.6  8.6   Hemoglobin 12.0 - 15.0 g/dL 9.7  75.1  9.2   Hematocrit 36.0 - 46.0 % 32.7  34.8  29.7   Platelets 150 - 400 K/uL 411  300  513       Latest Ref Rng & Units 04/26/2022    3:44 PM 10/05/2019   12:47 AM 09/22/2015    4:49 AM  CMP  Glucose 70 - 99 mg/dL 025  852  99   BUN 6 - 20 mg/dL 6  7  <5   Creatinine 7.78 - 1.00 mg/dL 2.42  3.53  6.14   Sodium 135 - 145 mmol/L 135  136  136   Potassium 3.5 - 5.1 mmol/L 3.6  3.6  3.5   Chloride 98 - 111 mmol/L 105  104  107   CO2 22 - 32 mmol/L 26  20  22    Calcium 8.9 - 10.3 mg/dL 9.0  8.5  7.8   Total Protein 6.5 - 8.1 g/dL 8.5  7.2    Total Bilirubin 0.3 - 1.2 mg/dL 0.4  0.4    Alkaline Phos 38 - 126 U/L 75  67    AST 15 - 41 U/L 16  22    ALT 0 - 44 U/L 11  12        Component Value Date/Time   IRON 16 (L) 04/26/2022 1544   TIBC 540 (H) 04/26/2022 1544   FERRITIN 2 (L) 04/26/2022 1544   IRONPCTSAT 3 (L) 04/26/2022 1544     RADIOGRAPHIC STUDIES: I have personally reviewed the radiological images as listed and agreed with the findings in the report. No results  found.

## 2022-04-26 NOTE — Assessment & Plan Note (Addendum)
I discussed about the potential risks including but not limited to allergic reactions/infusion reactions including anaphylactic reactions, phlebitis, high blood pressure, wheezing, SOB, skin rash, weight gain, leg swelling, headache, nausea and fatigue, etc. Patient tolerates oral iron supplement poorly and desires to achieved higher level of iron faster for adequate hematopoesis. Plan IV venofer weekly x 5 Recommend pregnancy testing prior to Venofer

## 2022-04-26 NOTE — Assessment & Plan Note (Addendum)
Recommend patient to continue follow up with gyn Check Von Willebrand panel

## 2022-04-27 ENCOUNTER — Telehealth: Payer: Self-pay

## 2022-04-27 DIAGNOSIS — D500 Iron deficiency anemia secondary to blood loss (chronic): Secondary | ICD-10-CM

## 2022-04-27 NOTE — Telephone Encounter (Signed)
-----   Message from Earlie Server, MD sent at 04/26/2022 11:20 PM EDT ----- Iron is low. Please arrange her do get IV iron. Lab pregnancy testing prior to Venofer weekly x 5.  Follow up 3 months, lab prior to MD + venofer 4 months.

## 2022-04-27 NOTE — Telephone Encounter (Signed)
Please schedule and inform pt of appts:   Lab (urine preg)/ Venofer weekly x5 Labs in 4 months  MD + venofer 1-2 days AFTER labs

## 2022-05-03 ENCOUNTER — Inpatient Hospital Stay: Payer: BC Managed Care – PPO

## 2022-05-03 VITALS — BP 118/72 | HR 79 | Temp 98.2°F | Resp 16

## 2022-05-03 DIAGNOSIS — D500 Iron deficiency anemia secondary to blood loss (chronic): Secondary | ICD-10-CM

## 2022-05-03 DIAGNOSIS — N920 Excessive and frequent menstruation with regular cycle: Secondary | ICD-10-CM | POA: Diagnosis not present

## 2022-05-03 LAB — PREGNANCY, URINE: Preg Test, Ur: NEGATIVE

## 2022-05-03 MED ORDER — SODIUM CHLORIDE 0.9 % IV SOLN
200.0000 mg | Freq: Once | INTRAVENOUS | Status: AC
  Administered 2022-05-03: 200 mg via INTRAVENOUS
  Filled 2022-05-03: qty 200

## 2022-05-03 MED ORDER — SODIUM CHLORIDE 0.9 % IV SOLN
Freq: Once | INTRAVENOUS | Status: AC
  Filled 2022-05-03: qty 250

## 2022-05-10 ENCOUNTER — Inpatient Hospital Stay: Payer: BC Managed Care – PPO

## 2022-05-10 VITALS — BP 115/71 | HR 79 | Temp 98.0°F | Resp 18

## 2022-05-10 DIAGNOSIS — D500 Iron deficiency anemia secondary to blood loss (chronic): Secondary | ICD-10-CM

## 2022-05-10 DIAGNOSIS — N920 Excessive and frequent menstruation with regular cycle: Secondary | ICD-10-CM | POA: Diagnosis not present

## 2022-05-10 LAB — PREGNANCY, URINE: Preg Test, Ur: NEGATIVE

## 2022-05-10 MED ORDER — SODIUM CHLORIDE 0.9 % IV SOLN
Freq: Once | INTRAVENOUS | Status: AC
  Filled 2022-05-10: qty 250

## 2022-05-10 MED ORDER — SODIUM CHLORIDE 0.9 % IV SOLN
200.0000 mg | Freq: Once | INTRAVENOUS | Status: AC
  Administered 2022-05-10: 200 mg via INTRAVENOUS
  Filled 2022-05-10: qty 200

## 2022-05-17 ENCOUNTER — Inpatient Hospital Stay: Payer: BC Managed Care – PPO

## 2022-05-17 VITALS — BP 106/60 | HR 74 | Temp 98.2°F | Resp 16

## 2022-05-17 DIAGNOSIS — D500 Iron deficiency anemia secondary to blood loss (chronic): Secondary | ICD-10-CM

## 2022-05-17 DIAGNOSIS — N920 Excessive and frequent menstruation with regular cycle: Secondary | ICD-10-CM | POA: Diagnosis not present

## 2022-05-17 LAB — PREGNANCY, URINE: Preg Test, Ur: NEGATIVE

## 2022-05-17 MED ORDER — SODIUM CHLORIDE 0.9 % IV SOLN
200.0000 mg | Freq: Once | INTRAVENOUS | Status: AC
  Administered 2022-05-17: 200 mg via INTRAVENOUS
  Filled 2022-05-17: qty 200

## 2022-05-17 MED ORDER — SODIUM CHLORIDE 0.9 % IV SOLN
Freq: Once | INTRAVENOUS | Status: AC
  Filled 2022-05-17: qty 250

## 2022-05-21 MED FILL — Iron Sucrose Inj 20 MG/ML (Fe Equiv): INTRAVENOUS | Qty: 10 | Status: AC

## 2022-05-24 ENCOUNTER — Inpatient Hospital Stay: Payer: BC Managed Care – PPO

## 2022-05-24 VITALS — BP 106/60 | HR 76 | Temp 99.3°F | Resp 16

## 2022-05-24 DIAGNOSIS — N920 Excessive and frequent menstruation with regular cycle: Secondary | ICD-10-CM | POA: Diagnosis not present

## 2022-05-24 DIAGNOSIS — D500 Iron deficiency anemia secondary to blood loss (chronic): Secondary | ICD-10-CM

## 2022-05-24 LAB — PREGNANCY, URINE: Preg Test, Ur: NEGATIVE

## 2022-05-24 MED ORDER — SODIUM CHLORIDE 0.9 % IV SOLN
200.0000 mg | Freq: Once | INTRAVENOUS | Status: AC
  Administered 2022-05-24: 200 mg via INTRAVENOUS
  Filled 2022-05-24: qty 200

## 2022-05-24 MED ORDER — SODIUM CHLORIDE 0.9 % IV SOLN
Freq: Once | INTRAVENOUS | Status: AC
  Filled 2022-05-24: qty 250

## 2022-05-31 ENCOUNTER — Inpatient Hospital Stay: Payer: BC Managed Care – PPO | Attending: Oncology

## 2022-05-31 ENCOUNTER — Inpatient Hospital Stay: Payer: BC Managed Care – PPO

## 2022-05-31 VITALS — BP 118/68 | HR 63 | Temp 98.1°F | Resp 16

## 2022-05-31 DIAGNOSIS — N920 Excessive and frequent menstruation with regular cycle: Secondary | ICD-10-CM | POA: Diagnosis present

## 2022-05-31 DIAGNOSIS — D500 Iron deficiency anemia secondary to blood loss (chronic): Secondary | ICD-10-CM

## 2022-05-31 LAB — PREGNANCY, URINE: Preg Test, Ur: NEGATIVE

## 2022-05-31 MED ORDER — SODIUM CHLORIDE 0.9 % IV SOLN
Freq: Once | INTRAVENOUS | Status: AC
  Filled 2022-05-31: qty 250

## 2022-05-31 MED ORDER — SODIUM CHLORIDE 0.9 % IV SOLN
200.0000 mg | Freq: Once | INTRAVENOUS | Status: AC
  Administered 2022-05-31: 200 mg via INTRAVENOUS
  Filled 2022-05-31: qty 10

## 2022-05-31 NOTE — Progress Notes (Signed)
Pt tolerated treatment without complaints.  VSS.  Pt denied 30 minute post observation.   

## 2022-05-31 NOTE — Patient Instructions (Signed)

## 2022-06-16 ENCOUNTER — Emergency Department
Admission: EM | Admit: 2022-06-16 | Discharge: 2022-06-16 | Disposition: A | Discharge status: Home / Self Care | Payer: BC Managed Care – PPO | Attending: Emergency Medicine | Admitting: Emergency Medicine

## 2022-06-16 ENCOUNTER — Other Ambulatory Visit: Payer: Self-pay

## 2022-06-16 DIAGNOSIS — J45909 Unspecified asthma, uncomplicated: Secondary | ICD-10-CM | POA: Diagnosis not present

## 2022-06-16 DIAGNOSIS — L02411 Cutaneous abscess of right axilla: Secondary | ICD-10-CM | POA: Insufficient documentation

## 2022-06-16 MED ORDER — HYDROCODONE-ACETAMINOPHEN 5-325 MG PO TABS: 1.0000 | ORAL_TABLET | Freq: Four times a day (QID) | ORAL | 0 refills | Status: DC | PRN

## 2022-06-16 MED ORDER — FLUCONAZOLE 150 MG PO TABS: 150.0000 mg | ORAL_TABLET | Freq: Every day | ORAL | 0 refills | Status: DC

## 2022-06-16 MED ORDER — CLINDAMYCIN HCL 300 MG PO CAPS: 300.0000 mg | ORAL_CAPSULE | Freq: Three times a day (TID) | ORAL | 0 refills | Status: AC

## 2022-06-16 MED ORDER — LIDOCAINE HCL (PF) 1 % IJ SOLN
5.0000 mL | Freq: Once | INTRAMUSCULAR | Status: AC
  Administered 2022-06-16: 5 mL
  Filled 2022-06-16: qty 5

## 2022-06-16 NOTE — ED Triage Notes (Signed)
Pt to ED POV for several "abscesses" under R axilla. PCP told her is from shaving. Present for about 2 years, stopped shaving months ago, then shaved again 1 week ago. Pt has movable swollen areas under R axilla, tender to touch.

## 2022-06-16 NOTE — Discharge Instructions (Addendum)
Use warm moist compresses to area frequently. Begin taking antibiotics until completely finished. Pain medications as needed for pain.  Do not drive while taking pain medicaiton.

## 2022-06-16 NOTE — ED Provider Notes (Signed)
[facility] Provider Note    Event Date/Time   First MD Initiated Contact with Patient 06/16/22 0830     (approximate)   History   Abscess   HPI  Carmen Rivers is a 26 y.o. female   states to the ED with complaint of this to the right axilla.  Patient has had similar in the past but they have drained on their own and this is the first time she has been seen medically for this.  She has a history of seizures, reactive airway disease, gastritis, asthma.      Physical Exam   Triage Vital Signs: ED Triage Vitals  Enc Vitals Group     BP 06/16/22 0824 114/70     Pulse Rate 06/16/22 0824 84     Resp 06/16/22 0824 18     Temp 06/16/22 0824 98.2 F (36.8 C)     Temp Source 06/16/22 0824 Oral     SpO2 06/16/22 0824 100 %     Weight 06/16/22 0821 220 lb (99.8 kg)     Height 06/16/22 0821 5\' 3"  (1.6 m)     Head Circumference --      Peak Flow --      Pain Score 06/16/22 0820 2     Pain Loc --      Pain Edu? --      Excl. in GC? --     Most recent vital signs: Vitals:   06/16/22 0824 06/16/22 0951  BP: 114/70 118/70  Pulse: 84 80  Resp: 18 18  Temp: 98.2 F (36.8 C)   SpO2: 100% 98%     General: Awake, no distress.  CV:  Good peripheral perfusion.  Resp:  Normal effort.  Abd:  No distention.  Other:  Examination of the right axilla there is a large abscess present with erythema generalized at the abscess area.  No extension at this time.  Area is soft and fluctuant.  Markedly tender to palpation.   ED Results / Procedures / Treatments   Labs (all labs ordered are listed, but only abnormal results are displayed) Labs Reviewed - No data to display   PROCEDURES:  Critical Care performed:   Marland KitchenMarland KitchenIncision and Drainage  Date/Time: 06/16/2022 8:52 AM  Performed by: Tommi Rumps, PA-C Authorized by: Tommi Rumps, PA-C   Consent:    Consent obtained:  Verbal   Consent given by:  Patient   Risks discussed:  Bleeding, infection,  incomplete drainage and pain   Alternatives discussed:  Alternative treatment, delayed treatment and observation Universal protocol:    Patient identity confirmed:  Verbally with patient Location:    Type:  Abscess   Location:  Upper extremity   Upper extremity location:  Arm   Arm location:  R upper arm Pre-procedure details:    Skin preparation:  Povidone-iodine Sedation:    Sedation type:  None Anesthesia:    Anesthesia method:  Local infiltration   Local anesthetic:  Lidocaine 1% w/o epi Procedure type:    Complexity:  Simple Procedure details:    Needle aspiration: no     Incision types:  Single straight   Incision depth:  Dermal   Wound management:  Probed and deloculated   Drainage:  Purulent   Drainage amount:  Moderate   Wound treatment:  Drain placed   Packing materials:  1/4 in iodoform gauze Post-procedure details:    Procedure completion:  Tolerated well, no immediate complications    MEDICATIONS ORDERED  IN ED: Medications  lidocaine (PF) (XYLOCAINE) 1 % injection 5 mL (5 mLs Infiltration Given by Other 06/16/22 0909)     IMPRESSION / MDM / ASSESSMENT AND PLAN / ED COURSE  I reviewed the triage vital signs and the nursing notes.   Differential diagnosis includes, but is not limited to, abscess, cellulitis right axilla.  Hidradenitis  26 year old female presents to the ED with complaint of abscess to the right axilla that started a couple of days ago and has gotten much larger.  Area was consistent with a abscess which was drained without any difficulty.  Patient is aware that the packing will need to be removed in 2 days and that this can be done at urgent care or return to the emergency department.  A prescription for antibiotics was sent to her pharmacy to begin taking until completely finished.  She is encouraged to use warm compresses to the area frequently and return to the emergency department for any severe worsening of her symptoms or development of  fever and chills.      Patient's presentation is most consistent with acute complicated illness / injury requiring diagnostic workup.  FINAL CLINICAL IMPRESSION(S) / ED DIAGNOSES   Final diagnoses:  Abscess of axilla, right     Rx / DC Orders   ED Discharge Orders          Ordered    clindamycin (CLEOCIN) 300 MG capsule  3 times daily        06/16/22 0938    HYDROcodone-acetaminophen (NORCO/VICODIN) 5-325 MG tablet  Every 6 hours PRN        06/16/22 0938    fluconazole (DIFLUCAN) 150 MG tablet  Daily        06/16/22 1037             Note:  This document was prepared using Dragon voice recognition software and may include unintentional dictation errors.   Tommi Rumps, PA-C 06/16/22 1130    Corena Herter, MD 06/16/22 1223

## 2022-07-26 NOTE — L&D Delivery Note (Signed)
OB/GYN Faculty Practice Delivery Note  Carmen Rivers is a 27 y.o. G1P0 s/p SVD at [redacted]w[redacted]d. She was admitted for for IOL for BPP 6/10 and A2 GDM.   ROM: 9h 72m with clear fluid GBS Status: Negative/-- (10/17 0000) Maximum Maternal Temperature: 98.33F  Labor Progress: Initial SVE: 0.5/50/-3. Ripened with Cytotec, foley balloon. Augmentation with pitocin. Epidural. She then progressed to complete @2239 .   Delivery Date/Time: 05/25/23 @2246   Delivery: Called to room and patient was complete and pushing. Head delivered with two maternal efforts. Loose nuchal cord delivered through. Shoulder and body delivered in usual fashion. Infant with spontaneous cry, placed on mother's abdomen, dried and stimulated. Cord clamped x 2 after 1-minute delay, and cut by support person. Cord blood drawn. Placenta delivered spontaneously with gentle cord traction. Fundus firm with massage, two finger lower uterine segment sweep, TXA and Pitocin. Labia, perineum, vagina, and cervix inspected with 2nd degree perineal laceration. Repaired with 3.0 Vicryl in standard fashion.   Baby Weight: pending  Placenta: 3 vessel, intact. Sent to L&D Complications: None Lacerations: 2nd degree perineal, repaired EBL: 421 mL Analgesia: Epidural   Infant:  APGAR (1 MIN): 8  APGAR (5 MINS): 9   Wyn Forster, MD OB Family Medicine Fellow, [facility] for [facility], [facility], [facility] 05/25/2023, 11:43 PM

## 2022-08-27 ENCOUNTER — Inpatient Hospital Stay: Payer: BC Managed Care – PPO | Attending: Oncology

## 2022-08-27 ENCOUNTER — Other Ambulatory Visit: Payer: BC Managed Care – PPO

## 2022-08-27 DIAGNOSIS — D500 Iron deficiency anemia secondary to blood loss (chronic): Secondary | ICD-10-CM | POA: Insufficient documentation

## 2022-08-27 DIAGNOSIS — N920 Excessive and frequent menstruation with regular cycle: Secondary | ICD-10-CM | POA: Diagnosis present

## 2022-08-27 LAB — CBC WITH DIFFERENTIAL/PLATELET
Abs Immature Granulocytes: 0.01 10*3/uL (ref 0.00–0.07)
Basophils Absolute: 0 10*3/uL (ref 0.0–0.1)
Basophils Relative: 0 %
Eosinophils Absolute: 0 10*3/uL (ref 0.0–0.5)
Eosinophils Relative: 0 %
HCT: 41.3 % (ref 36.0–46.0)
Hemoglobin: 13.9 g/dL (ref 12.0–15.0)
Immature Granulocytes: 0 %
Lymphocytes Relative: 38 %
Lymphs Abs: 3.3 10*3/uL (ref 0.7–4.0)
MCH: 26.8 pg (ref 26.0–34.0)
MCHC: 33.7 g/dL (ref 30.0–36.0)
MCV: 79.6 fL — ABNORMAL LOW (ref 80.0–100.0)
Monocytes Absolute: 0.5 10*3/uL (ref 0.1–1.0)
Monocytes Relative: 5 %
Neutro Abs: 4.9 10*3/uL (ref 1.7–7.7)
Neutrophils Relative %: 57 %
Platelets: 321 10*3/uL (ref 150–400)
RBC: 5.19 MIL/uL — ABNORMAL HIGH (ref 3.87–5.11)
RDW: 15 % (ref 11.5–15.5)
WBC: 8.8 10*3/uL (ref 4.0–10.5)
nRBC: 0 % (ref 0.0–0.2)

## 2022-08-27 LAB — IRON AND TIBC
Iron: 41 ug/dL (ref 28–170)
Saturation Ratios: 9 % — ABNORMAL LOW (ref 10.4–31.8)
TIBC: 447 ug/dL (ref 250–450)
UIBC: 406 ug/dL

## 2022-08-27 LAB — FERRITIN: Ferritin: 15 ng/mL (ref 11–307)

## 2022-08-30 ENCOUNTER — Inpatient Hospital Stay: Payer: BC Managed Care – PPO

## 2022-08-30 ENCOUNTER — Inpatient Hospital Stay: Payer: BC Managed Care – PPO | Admitting: Oncology

## 2022-08-30 ENCOUNTER — Encounter: Payer: Self-pay | Admitting: Oncology

## 2022-08-30 VITALS — BP 137/84 | HR 87 | Temp 97.4°F | Resp 18 | Wt 234.9 lb

## 2022-08-30 VITALS — BP 121/86

## 2022-08-30 DIAGNOSIS — D500 Iron deficiency anemia secondary to blood loss (chronic): Secondary | ICD-10-CM

## 2022-08-30 MED ORDER — SODIUM CHLORIDE 0.9 % IV SOLN
INTRAVENOUS | Status: DC
  Filled 2022-08-30: qty 250

## 2022-08-30 MED ORDER — SODIUM CHLORIDE 0.9 % IV SOLN
200.0000 mg | Freq: Once | INTRAVENOUS | Status: AC
  Administered 2022-08-30: 200 mg via INTRAVENOUS
  Filled 2022-08-30: qty 200

## 2022-08-30 NOTE — Assessment & Plan Note (Addendum)
She tolerate Venofer treatments.  Labs are reviewed and discussed with patient. Iron saturation 9, ferritin 15, normal hemoglobin Recommend IV venofer x 1 today

## 2022-08-30 NOTE — Progress Notes (Signed)
Pt here for follow up. No new concerns voiced.   

## 2022-08-30 NOTE — Progress Notes (Signed)
Hematology/Oncology Consult note [phone] Fax:([phone]) [phone]    CHIEF COMPLAINTS/REASON FOR VISIT:  Anemia  ASSESSMENT & PLAN:   IDA (iron deficiency anemia) She tolerate Venofer treatments.  Labs are reviewed and discussed with patient. Iron saturation 9, ferritin 15, normal hemoglobin Recommend IV venofer x 1 today    Orders Placed This Encounter  Procedures   CBC with Differential/Platelet    Standing Status:   Future    Standing Expiration Date:   08/31/2023   Iron and TIBC    Standing Status:   Future    Standing Expiration Date:   08/31/2023   Ferritin    Standing Status:   Future    Standing Expiration Date:   08/31/2023   Follow up in 6 months.  All questions were answered. The patient knows to call the clinic with any problems, questions or concerns.  Earlie Server, MD, PhD [facility] Hematology Oncology 08/30/2022     HISTORY OF PRESENTING ILLNESS:  Carmen Rivers is a  27 y.o.  female with PMH listed below who was referred to me for anemia Reviewed patient's recent labs that was done.  She was found to have abnormal CBC with Hb of 9.5, microcytic.  + heavy menstrual period, + fatigue.  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  She denies over the counter NSAID ingestion.  She has tried different types of oral iron supplementation and she is not able to tolerate due to nausea.   INTERVAL HISTORY Carmen Rivers is a 27 y.o. female who has above history reviewed by me today presents for follow up visit for iron deficiency anemia.  She tolerates IV venofer.  Fatigue has improved.  Still has heavy menstrual period  MEDICAL HISTORY:  Past Medical History:  Diagnosis Date   Anemia    Asthma    Gastritis    Reactive airway disease    Seasonal allergies     Seizures ([place])     SURGICAL HISTORY: Past Surgical History:  Procedure Laterality Date   NO PAST SURGERIES      SOCIAL HISTORY: Social History   Socioeconomic History   Marital status: Single    Spouse name: Not on file   Number of children: Not on file   Years of education: Not on file   Highest education level: Not on file  Occupational History   Not on file  Tobacco Use   Smoking status: Every Day    Types: E-cigarettes   Smokeless tobacco: Never  Substance and Sexual Activity   Alcohol use: Yes    Alcohol/week: 1.0 standard drink of alcohol    Types: 1 Standard drinks or equivalent per week    Comment: Last ETOH use 07/19/20   Drug use: Yes    Types: Marijuana    Comment: Last marijuana use Christmas 07/19/20.   Sexual activity: Yes    Partners: Male    Birth control/protection: Condom  Other Topics Concern   Not on file  Social History Narrative   Not on file   Social Determinants of Health   Financial Resource Strain: Not on file  Food Insecurity: Not on file  Transportation Needs: Not on file  Physical Activity: Not on file  Stress: Not on file  Social Connections: Not on file  Intimate Partner Violence: Not on file  FAMILY HISTORY: Family History  Problem Relation Age of Onset   Cancer Maternal Grandmother        Ovarian cancer    ALLERGIES:  is allergic to estrogens.  MEDICATIONS:  Current Outpatient Medications  Medication Sig Dispense Refill   albuterol (PROVENTIL HFA;VENTOLIN HFA) 108 (90 Base) MCG/ACT inhaler Inhale 2 puffs into the lungs every 6 (six) hours as needed for wheezing or shortness of breath. 1 Inhaler 0   DULERA 50-5 MCG/ACT AERO Inhale into the lungs.     fluconazole (DIFLUCAN) 150 MG tablet Take 1 tablet (150 mg total) by mouth daily. 1 tablet 0   HYDROcodone-acetaminophen (NORCO/VICODIN) 5-325 MG tablet Take 1 tablet by mouth every 6 (six) hours as needed. 15 tablet 0   montelukast (SINGULAIR) 10 MG tablet Take 10 mg  by mouth daily.     omeprazole (PRILOSEC) 20 MG capsule Take 20 mg by mouth daily.     BAC 50-325-40 MG tablet Take 1-2 tablets by mouth every 4 (four) hours as needed. (Patient not taking: Reported on 08/30/2022)     No current facility-administered medications for this visit.    Review of Systems  Constitutional:  Positive for fatigue. Negative for appetite change, chills and fever.  HENT:   Negative for hearing loss and voice change.   Eyes:  Negative for eye problems.  Respiratory:  Negative for chest tightness and cough.   Cardiovascular:  Negative for chest pain.  Gastrointestinal:  Negative for abdominal distention, abdominal pain and blood in stool.  Endocrine: Negative for hot flashes.  Genitourinary:  Positive for menstrual problem. Negative for difficulty urinating and frequency.   Musculoskeletal:  Negative for arthralgias.  Skin:  Negative for itching and rash.  Neurological:  Negative for extremity weakness.  Hematological:  Negative for adenopathy.  Psychiatric/Behavioral:  Negative for confusion.     PHYSICAL EXAMINATION: ECOG PERFORMANCE STATUS: 1 - Symptomatic but completely ambulatory Vitals:   08/30/22 1441  BP: 137/84  Pulse: 87  Resp: 18  Temp: (!) 97.4 F (36.3 C)   Filed Weights   08/30/22 1441  Weight: 234 lb 14.4 oz (106.5 kg)    Physical Exam Constitutional:      General: She is not in acute distress. HENT:     Head: Normocephalic and atraumatic.  Eyes:     General: No scleral icterus. Cardiovascular:     Rate and Rhythm: Normal rate and regular rhythm.     Heart sounds: Normal heart sounds.  Pulmonary:     Effort: Pulmonary effort is normal. No respiratory distress.     Breath sounds: No wheezing.  Abdominal:     General: Bowel sounds are normal. There is no distension.     Palpations: Abdomen is soft.  Musculoskeletal:        General: No deformity. Normal range of motion.     Cervical back: Normal range of motion and neck supple.   Skin:    General: Skin is warm and dry.     Findings: No erythema or rash.  Neurological:     Mental Status: She is alert and oriented to person, place, and time. Mental status is at baseline.     Cranial Nerves: No cranial nerve deficit.     Coordination: Coordination normal.  Psychiatric:        Mood and Affect: Mood normal.      LABORATORY DATA:  I have reviewed the data as listed    Latest Ref Rng & Units 08/27/2022  4:02 PM 04/26/2022    3:44 PM 10/05/2019   12:47 AM  CBC  WBC 4.0 - 10.5 K/uL 8.8  9.3  9.6   Hemoglobin 12.0 - 15.0 g/dL 13.9  9.7  10.5   Hematocrit 36.0 - 46.0 % 41.3  32.7  34.8   Platelets 150 - 400 K/uL 321  411  300       Latest Ref Rng & Units 04/26/2022    3:44 PM 10/05/2019   12:47 AM 09/22/2015    4:49 AM  CMP  Glucose 70 - 99 mg/dL 104  127  99   BUN 6 - 20 mg/dL 6  7  <5   Creatinine 0.44 - 1.00 mg/dL 0.63  0.71  0.62   Sodium 135 - 145 mmol/L 135  136  136   Potassium 3.5 - 5.1 mmol/L 3.6  3.6  3.5   Chloride 98 - 111 mmol/L 105  104  107   CO2 22 - 32 mmol/L 26  20  22    Calcium 8.9 - 10.3 mg/dL 9.0  8.5  7.8   Total Protein 6.5 - 8.1 g/dL 8.5  7.2    Total Bilirubin 0.3 - 1.2 mg/dL 0.4  0.4    Alkaline Phos 38 - 126 U/L 75  67    AST 15 - 41 U/L 16  22    ALT 0 - 44 U/L 11  12        Component Value Date/Time   IRON 41 08/27/2022 1602   TIBC 447 08/27/2022 1602   FERRITIN 15 08/27/2022 1602   IRONPCTSAT 9 (L) 08/27/2022 1602     RADIOGRAPHIC STUDIES: I have personally reviewed the radiological images as listed and agreed with the findings in the report. No results found.

## 2022-08-30 NOTE — Patient Instructions (Signed)

## 2022-09-01 LAB — VON WILLEBRAND PANEL
Coagulation Factor VIII: 174 % — ABNORMAL HIGH (ref 56–140)
Ristocetin Co-factor, Plasma: 59 % (ref 50–200)
Von Willebrand Antigen, Plasma: 150 % (ref 50–200)

## 2022-09-01 LAB — COAG STUDIES INTERP REPORT

## 2022-11-15 LAB — OB RESULTS CONSOLE HEPATITIS B SURFACE ANTIGEN: Hepatitis B Surface Ag: NEGATIVE

## 2022-11-15 LAB — OB RESULTS CONSOLE RUBELLA ANTIBODY, IGM: Rubella: IMMUNE

## 2022-12-23 ENCOUNTER — Other Ambulatory Visit: Payer: Self-pay | Admitting: Family Medicine

## 2022-12-23 DIAGNOSIS — Z3402 Encounter for supervision of normal first pregnancy, second trimester: Secondary | ICD-10-CM

## 2022-12-29 ENCOUNTER — Ambulatory Visit: Payer: BC Managed Care – PPO | Attending: Family Medicine | Admitting: Obstetrics and Gynecology

## 2022-12-29 ENCOUNTER — Ambulatory Visit: Payer: BC Managed Care – PPO

## 2022-12-29 ENCOUNTER — Other Ambulatory Visit: Payer: Self-pay

## 2022-12-29 ENCOUNTER — Other Ambulatory Visit: Payer: Self-pay | Admitting: Family Medicine

## 2022-12-29 DIAGNOSIS — O281 Abnormal biochemical finding on antenatal screening of mother: Secondary | ICD-10-CM

## 2022-12-29 DIAGNOSIS — O283 Abnormal ultrasonic finding on antenatal screening of mother: Secondary | ICD-10-CM

## 2022-12-30 ENCOUNTER — Ambulatory Visit: Payer: BC Managed Care – PPO | Attending: Obstetrics and Gynecology

## 2022-12-30 DIAGNOSIS — Z3A00 Weeks of gestation of pregnancy not specified: Secondary | ICD-10-CM | POA: Diagnosis not present

## 2022-12-30 DIAGNOSIS — O281 Abnormal biochemical finding on antenatal screening of mother: Secondary | ICD-10-CM | POA: Insufficient documentation

## 2022-12-30 DIAGNOSIS — Z31430 Encounter of female for testing for genetic disease carrier status for procreative management: Secondary | ICD-10-CM | POA: Insufficient documentation

## 2022-12-30 NOTE — Progress Notes (Signed)
Virtual Visit via Telephone Note  I connected with Carmen Rivers on 12/30/22 at  3:00 PM EDT by telephone and verified that I am speaking with the correct person using two identifiers.  Location: Patient: home Provider: [facility] [place]   I discussed the limitations, risks, security and privacy concerns of performing an evaluation and management service by telephone and the availability of in person as well as video appointments. I also discussed with the patient that there may be a patient responsible charge related to this service. The patient expressed understanding and agreed to proceed.  Referring Provider:  Oswaldo Conroy Length of Consultation: 40 minutes  Carmen Rivers was referred to [facility] for genetic counseling and targeted ultrasound because of an increased risk for Down syndrome by the maternal serum prenatal screen.  The patient was present on this visit alone. We reviewed the following:  Abnormal Maternal Serum Screening: We provided background information on the maternal serum prenatal screen.  It was explained that this is a maternal blood test performed between the 14th and 21st week of pregnancy which measures several pregnancy proteins.  The levels of these proteins can help determine if a pregnancy is at high risk for certain birth defects or problems.  However, it cannot diagnose or rule out these defects.  An abnormal maternal serum screen does not necessarily mean that the baby has a problem. Accurate dating of the pregnancy is also critical for interpretation of this screening test, as the expected value of these proteins varies with each week of pregnancy.  Maternal serum screening can identify approximately 80% of neural tube defects, up to 75% of Down syndrome cases and 60% of trisomy 18 cases.  The neural tube consists of the fetal head and spine and if this structure fails to close during development, spina bifida (open spine) or  anencephaly (open skull) could result.  Chromosomes are the inherited structures that contain our instructions for development (genes).  Each cell in our body normally has 46 chromosomes.  Rarely, when an egg and sperm unite, an extra or missing chromosome can be passed on to the baby by mistake.  These types of chromosome problems typically cause mental retardation and might also cause birth defects.  We discussed Down syndrome (an extra chromosome #21) and trisomy 64 (an extra chromosome #18).    The maternal serum screen revealed protein levels that increased the chance of Down syndrome (Trisomy 21) in the pregnancy.  Before maternal serum screening, the age-related chance of Down syndrome in the pregnancy was 1 in 936.  Given the maternal serum screen results, the chance is now estimated to be 1 in 37.  This means that the chance the baby does not have Down sydnrome is greater than 96 percent. The chance for open spina bifida and trisomy 32 were not increased.  If ultrasound were to indicate that the gestational age is different from expected by more than 2 weeks, this result is not expected to be accurate and would need to be further evaluated.  The following prenatal testing options for this pregnancy were made available:  Targeted ultrasound uses high frequency sound waves to create an image of the developing fetus.  An ultrasound is often recommended as a routine means of evaluating the pregnancy.  It is also used to screen for fetal anatomy problems (for example, a heart defect) that might be suggestive of a chromosomal or other abnormality.    Amniocentesis involves the removal  of a small amount of amniotic fluid from the sac surrounding the fetus with the use of a thin needle inserted through the maternal abdomen and uterus.  Ultrasound guidance is used throughout the procedure.  Fetal cells are directly evaluated to detect chromosome conditions and > 98% of neural tube defects.  The main risks to  this procedure include complications leading to miscarriage in less than 1 in 500 cases.    We also reviewed the availability of cell free fetal DNA testing from maternal blood to determine whether or not the baby may have Down syndrome, trisomy 54, or trisomy 72.  This test utilizes a maternal blood sample and DNA sequencing technology to isolate circulating cell free fetal DNA from maternal plasma.  The fetal DNA can then be analyzed for DNA sequences that are derived from the three most common chromosomes involved in aneuploidy, chromosomes 13, 18, and 21.  If the overall amount of DNA is greater than the expected level for any of these chromosomes, aneuploidy is suspected.  While we do not consider it a replacement for invasive testing and karyotype analysis, a negative result from this testing would be reassuring, though not a guarantee of a normal chromosome complement for the baby.  An abnormal result is certainly suggestive of an abnormal chromosome complement, though we would still recommend CVS or amniocentesis to confirm any findings from this testing.  Carrier screening. Per the ACOG Committee Opinion 691, all women who are considering a pregnancy or are currently pregnant should be offered carrier screening for, at minimum, Cystic Fibrosis (CF), Spinal Muscular Atrophy (SMA), and Hemoglobinopathies The mode of inheritance, clinical manifestations of these conditions, as well as details about testing were reviewed. A negative result on carrier screening reduces the likelihood of being a carrier, however, does not entirely rule out the possibility. If Carmen Rivers was found to be a carrier for a specific condition, carrier screening for her reproductive partner would be recommended.  Family and Pregnancy history: We obtained a detailed family history and pregnancy history.  This is the first pregnancy for Carmen Rivers.  Her partner, Carmen Rivers, has 4 healthy children from prior relationships. She reported no  complications and no exposure to alcohol, tobacco or recreational drugs in this pregnancy.  Carmen Rivers is taking allergy medication and an inhaler for seasonal allergies.  She has a personal history of a pulmonary embolism in 2017 while on oral contraceptive pills that she was prescribed for heavy bleeding during her periods.  A workup for inherited and acquired thrombophilias was performed at that time.  We are recommending that she meet with Maternal-Fetal Medicine to review this history and appropriate management for thrombophilia during pregnancy.  Lastly, she has a history of 2 seizures in the past which she reports were related to stress. She has not been on any medications for seizures in at least 2 years and reports no further seizure activity.  In the family history, Carmen Rivers reported ovarian cancer in her maternal grandmother and possible ovarian cysts in her mother and maternal aunt though the specific details are unclear.  If there were to be an extensive family history of ovarian cancer, we would recommend cancer genetic counseling for this family. The remainder of the family history is unremarkable for birth defects, developmental delays, recurrent pregnancy loss or known chromosome abnormalities.  The patient and her partner are both reported to be of Black ancestry and are not related to each other.   Plan of care: Carmen Rivers desires to have  Panorama cell free DNA testing as well as [facility] carrier screening drawn on 12/30/22. We were able to schedule an ultrasound in the Maternal[facility]Fetal Care clinic at [facility] on Wednesday, June 12 at 9 AM. The patient is also scheduled for a consultation with Dr. Parke Poisson, the MFM specialist, following that ultrasound.  The purpose of this visit is to review her history of a pulmonary embolism and appropriate management of the pregnancy given that history. At this time, the patient declined amniocentesis pending the results of the ultrasound to evaluate the  gestational age, and therefore accuracy of the maternal serum screening, as well as awaiting the results of the panorama aneuploidy screening.  The patient was encouraged to call with questions or concerns. We can be contacted at [phone].  I provided 40 minutes of non-face-to-face time during this encounter.   Anner Crete, Jaci Standard, MS, CGC

## 2023-01-03 ENCOUNTER — Other Ambulatory Visit: Payer: Self-pay

## 2023-01-05 ENCOUNTER — Other Ambulatory Visit: Payer: Self-pay

## 2023-01-05 ENCOUNTER — Ambulatory Visit: Payer: BC Managed Care – PPO | Attending: Obstetrics

## 2023-01-05 ENCOUNTER — Ambulatory Visit (HOSPITAL_BASED_OUTPATIENT_CLINIC_OR_DEPARTMENT_OTHER): Payer: BC Managed Care – PPO | Admitting: Obstetrics

## 2023-01-05 DIAGNOSIS — E669 Obesity, unspecified: Secondary | ICD-10-CM

## 2023-01-05 DIAGNOSIS — O280 Abnormal hematological finding on antenatal screening of mother: Secondary | ICD-10-CM | POA: Diagnosis not present

## 2023-01-05 DIAGNOSIS — O99212 Obesity complicating pregnancy, second trimester: Secondary | ICD-10-CM | POA: Diagnosis not present

## 2023-01-05 DIAGNOSIS — O283 Abnormal ultrasonic finding on antenatal screening of mother: Secondary | ICD-10-CM | POA: Insufficient documentation

## 2023-01-05 DIAGNOSIS — Z3A18 18 weeks gestation of pregnancy: Secondary | ICD-10-CM | POA: Diagnosis not present

## 2023-01-05 DIAGNOSIS — O281 Abnormal biochemical finding on antenatal screening of mother: Secondary | ICD-10-CM

## 2023-01-05 DIAGNOSIS — Z86711 Personal history of pulmonary embolism: Secondary | ICD-10-CM

## 2023-01-05 NOTE — Progress Notes (Signed)
MFM Note  Carmen Rivers was seen as Carmen Rivers quad screen indicated an increased Down syndrome risk of 1:37.  She is currently at 18 weeks and 6 days.   The patient met with our genetic counselor last week and had a cell free DNA test drawn to help further refine Carmen Rivers Down syndrome risk. The results of that test are currently pending.  The patient reports that she developed a pulmonary embolus in 2017 while taking birth control pills.  She underwent a thrombophilia workup and a workup for antiphospholipid antibodies which were all negative.  She also reports a history of a seizure disorder that is not currently treated with any medications.  She was informed that the fetal growth and amniotic fluid level were appropriate for Carmen Rivers gestational age.   There were no obvious fetal anomalies noted on today's ultrasound exam.  However, today's exam was limited due to maternal body habitus and the fetal position.  The patient was informed that anomalies may be missed due to technical limitations. If the fetus is in a suboptimal position or maternal habitus is increased, visualization of the fetus in the maternal uterus may be impaired.  The following were discussed during today's consultation:  Elevated Down syndrome risk noted on Carmen Rivers quad screen  Due to Carmen Rivers elevated Down syndrome risk, she was offered and declined an amniocentesis today for definitive diagnosis.    She will await the results of the cell free DNA test before pursuing any further testing.  Due to the abnormal serum analytes noted on Carmen Rivers quad screen, we will continue to follow Carmen Rivers with growth ultrasounds throughout Carmen Rivers pregnancy.  History of a pulmonary embolus while on birth control pills  The patient was advised that pregnancy is a hypercoagulable state and therefore due to Carmen Rivers history of a prior pulmonary embolus, she may be at risk for another thromboembolic event during Carmen Rivers pregnancy.  Due to the elevated risk of another thromboembolic  event in pregnancy, I recommend that the patient receive prophylactic Lovenox to be started as soon as possible and to be continued for the duration of Carmen Rivers pregnancy and 6 weeks postpartum.  Due to Carmen Rivers weight of 244 pounds (roughly 100 kg), I would recommend that she receive Lovenox 100 mg once a day.  The patient has stated that she is willing to give herself the daily injections.    Due to maternal obesity, she should also be started on a daily baby aspirin (81 mg) for preeclampsia prophylaxis.  The recommendations for Lovenox and a daily baby aspirin were discussed with a provider at [facility].  She will prescribe the medications for the patient.  She will return in 4 weeks for another ultrasound to assess the fetal growth and to complete the views of the fetal anatomy.  A total of 45 minutes was spent counseling and coordinating the care for this patient.  Greater than 50% of the time was spent in direct face-to-face contact.

## 2023-01-10 ENCOUNTER — Ambulatory Visit: Payer: BC Managed Care – PPO

## 2023-01-10 LAB — PANORAMA PRENATAL TEST FULL PANEL:PANORAMA TEST PLUS 5 ADDITIONAL MICRODELETIONS: FETAL FRACTION: 5

## 2023-01-12 ENCOUNTER — Telehealth: Payer: Self-pay | Admitting: Obstetrics and Gynecology

## 2023-01-12 LAB — HORIZON CUSTOM: REPORT SUMMARY: NEGATIVE

## 2023-01-12 NOTE — Telephone Encounter (Signed)
We spoke with Carmen Rivers to review the results of carrier screening.  She elected to have carrier screening with the [employer].  In keeping with the ACOG Committee Opinion 691,  this test includes Cystic Fibrosis (CF), Spinal Muscular Atrophy (SMA), and Hemoglobinopathies The mode of inheritance, clinical manifestations of these conditions, as well as details about testing were reviewed. The results of this testing were negative, which greatly reduces, but cannot eliminate, the chance for her to be a carrier for these conditions or have a child with these conditions.  See the [employer] report for details and residual risk information.  At this time, we do not recommend testing for her reproductive partner.  We also reviewed theresults of [facility] NIPS through the laboratory [facility] that was low-risk for fetal aneuploidies. We reviewed that these results showed a less than 1 in 10,000 risk for trisomies 21, 18 and 13, and monosomy X (Turner syndrome). In addition, the risk for triploidy and sex chromosome trisomies (47,XXX and 47,XXY) was also low. Gender was also reported for this sample, but the patient requested not to obtain this information today. She was informed that it is on the lab report in MyChart should she desire to see it.  While this testing identifies 94-99% of pregnancies with trisomy 49, trisomy 106, and trisomy 6, and >70% of cases of sex chromosome aneuploidies, it is NOT diagnostic. A positive test result requires confirmation by CVS or amniocentesis, and a negative test result does not rule out a fetal chromosome abnormality. She also understands that this testing does not identify all genetic conditions.  If screening for open neural tube defects is desired, we would recommend a maternal serum AFP only in the second trimester to be drawn through her OB.  We may be reached at [phone] with any questions.  Cherly Anderson, MS, CGC

## 2023-01-20 ENCOUNTER — Other Ambulatory Visit: Payer: BC Managed Care – PPO

## 2023-01-20 ENCOUNTER — Ambulatory Visit: Payer: BC Managed Care – PPO

## 2023-01-31 ENCOUNTER — Encounter: Payer: Self-pay | Admitting: Oncology

## 2023-02-02 ENCOUNTER — Other Ambulatory Visit: Payer: Self-pay

## 2023-02-02 DIAGNOSIS — O280 Abnormal hematological finding on antenatal screening of mother: Secondary | ICD-10-CM

## 2023-02-02 DIAGNOSIS — O99212 Obesity complicating pregnancy, second trimester: Secondary | ICD-10-CM

## 2023-02-02 DIAGNOSIS — Z86711 Personal history of pulmonary embolism: Secondary | ICD-10-CM

## 2023-02-07 ENCOUNTER — Other Ambulatory Visit: Payer: Self-pay

## 2023-02-07 ENCOUNTER — Ambulatory Visit: Payer: BC Managed Care – PPO | Attending: Obstetrics and Gynecology

## 2023-02-07 DIAGNOSIS — Z86711 Personal history of pulmonary embolism: Secondary | ICD-10-CM | POA: Insufficient documentation

## 2023-02-07 DIAGNOSIS — E669 Obesity, unspecified: Secondary | ICD-10-CM | POA: Diagnosis not present

## 2023-02-07 DIAGNOSIS — O289 Unspecified abnormal findings on antenatal screening of mother: Secondary | ICD-10-CM | POA: Diagnosis present

## 2023-02-07 DIAGNOSIS — Z3A23 23 weeks gestation of pregnancy: Secondary | ICD-10-CM

## 2023-02-07 DIAGNOSIS — O88212 Thromboembolism in pregnancy, second trimester: Secondary | ICD-10-CM

## 2023-02-07 DIAGNOSIS — O99212 Obesity complicating pregnancy, second trimester: Secondary | ICD-10-CM

## 2023-02-07 DIAGNOSIS — Z7902 Long term (current) use of antithrombotics/antiplatelets: Secondary | ICD-10-CM | POA: Insufficient documentation

## 2023-02-07 DIAGNOSIS — O280 Abnormal hematological finding on antenatal screening of mother: Secondary | ICD-10-CM

## 2023-02-22 LAB — OB RESULTS CONSOLE HIV ANTIBODY (ROUTINE TESTING): HIV: NONREACTIVE

## 2023-02-28 ENCOUNTER — Encounter: Payer: Self-pay | Admitting: Oncology

## 2023-02-28 ENCOUNTER — Inpatient Hospital Stay: Payer: BC Managed Care – PPO | Attending: Oncology

## 2023-02-28 ENCOUNTER — Inpatient Hospital Stay (HOSPITAL_BASED_OUTPATIENT_CLINIC_OR_DEPARTMENT_OTHER): Payer: BC Managed Care – PPO | Admitting: Oncology

## 2023-02-28 VITALS — BP 108/63 | HR 88 | Temp 98.0°F | Resp 18 | Wt 249.8 lb

## 2023-02-28 DIAGNOSIS — D500 Iron deficiency anemia secondary to blood loss (chronic): Secondary | ICD-10-CM

## 2023-02-28 DIAGNOSIS — D509 Iron deficiency anemia, unspecified: Secondary | ICD-10-CM | POA: Insufficient documentation

## 2023-02-28 DIAGNOSIS — O99013 Anemia complicating pregnancy, third trimester: Secondary | ICD-10-CM | POA: Diagnosis not present

## 2023-02-28 DIAGNOSIS — Z3A29 29 weeks gestation of pregnancy: Secondary | ICD-10-CM | POA: Diagnosis not present

## 2023-02-28 LAB — CBC WITH DIFFERENTIAL/PLATELET
Abs Immature Granulocytes: 0.03 10*3/uL (ref 0.00–0.07)
Basophils Absolute: 0 10*3/uL (ref 0.0–0.1)
Basophils Relative: 0 %
Eosinophils Absolute: 0 10*3/uL (ref 0.0–0.5)
Eosinophils Relative: 0 %
HCT: 34.4 % — ABNORMAL LOW (ref 36.0–46.0)
Hemoglobin: 11.3 g/dL — ABNORMAL LOW (ref 12.0–15.0)
Immature Granulocytes: 0 %
Lymphocytes Relative: 25 %
Lymphs Abs: 2.6 10*3/uL (ref 0.7–4.0)
MCH: 27.4 pg (ref 26.0–34.0)
MCHC: 32.8 g/dL (ref 30.0–36.0)
MCV: 83.5 fL (ref 80.0–100.0)
Monocytes Absolute: 0.6 10*3/uL (ref 0.1–1.0)
Monocytes Relative: 6 %
Neutro Abs: 7.2 10*3/uL (ref 1.7–7.7)
Neutrophils Relative %: 69 %
Platelets: 297 10*3/uL (ref 150–400)
RBC: 4.12 MIL/uL (ref 3.87–5.11)
RDW: 12.7 % (ref 11.5–15.5)
WBC: 10.4 10*3/uL (ref 4.0–10.5)
nRBC: 0 % (ref 0.0–0.2)

## 2023-02-28 LAB — IRON AND TIBC
Iron: 43 ug/dL (ref 28–170)
Saturation Ratios: 8 % — ABNORMAL LOW (ref 10.4–31.8)
TIBC: 553 ug/dL — ABNORMAL HIGH (ref 250–450)
UIBC: 510 ug/dL

## 2023-02-28 LAB — FERRITIN: Ferritin: 12 ng/mL (ref 11–307)

## 2023-02-28 NOTE — Assessment & Plan Note (Signed)
She tolerate Venofer treatments.  Labs are reviewed and discussed with patient. Lab Results  Component Value Date   HGB 11.3 (L) 02/28/2023   TIBC 553 (H) 02/28/2023   IRONPCTSAT 8 (L) 02/28/2023   FERRITIN 12 02/28/2023    Recommend IV venofer weekly x 3

## 2023-02-28 NOTE — Progress Notes (Signed)
Hematology/Oncology Consult note [phone] Fax:([phone]) [phone]    CHIEF COMPLAINTS/REASON FOR VISIT:  Anemia  ASSESSMENT & PLAN:   IDA (iron deficiency anemia) She tolerate Venofer treatments.  Labs are reviewed and discussed with patient. Lab Results  Component Value Date   HGB 11.3 (L) 02/28/2023   TIBC 553 (H) 02/28/2023   IRONPCTSAT 8 (L) 02/28/2023   FERRITIN 12 02/28/2023    Recommend IV venofer weekly x 3  Anemia in pregnancy, see above   Orders Placed This Encounter  Procedures   CBC with Differential (Cancer Center Only)    Standing Status:   Future    Standing Expiration Date:   02/28/2024   Iron and TIBC    Standing Status:   Future    Standing Expiration Date:   02/28/2024   Ferritin    Standing Status:   Future    Standing Expiration Date:   02/28/2024   Follow up in 2 months.  All questions were answered. The patient knows to call the clinic with any problems, questions or concerns.  Rickard Patience, MD, PhD [facility] Hematology Oncology 02/28/2023     HISTORY OF PRESENTING ILLNESS:  Carmen Rivers is a  27 y.o.  female with PMH listed below who was referred to me for anemia Reviewed patient's recent labs that was done.  She was found to have abnormal CBC with Hb of 9.5, microcytic.  + heavy menstrual period, + fatigue.  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  She denies over the counter NSAID ingestion.  She has tried different types of oral iron supplementation and she is not able to tolerate due to nausea.   INTERVAL HISTORY Carmen Rivers is a 27 y.o. female who has above history reviewed by me today presents for follow up visit for iron deficiency anemia.  She is pregnant, due date is in Nov 2024  MEDICAL HISTORY:  Past Medical History:   Diagnosis Date   Anemia    Asthma    Gastritis    Reactive airway disease    Seasonal allergies    Seizures (HCC)     SURGICAL HISTORY: Past Surgical History:  Procedure Laterality Date   NO PAST SURGERIES      SOCIAL HISTORY: Social History   Socioeconomic History   Marital status: Single    Spouse name: Natasha Bence   Number of children: 0   Years of education: Not on file   Highest education level: Bachelor's degree (e.g., BA, AB, BS)  Occupational History   Occupation: Runner, broadcasting/film/video    Comment: Freight forwarder  Tobacco Use   Smoking status: Former    Types: E-cigarettes   Smokeless tobacco: Never  Vaping Use   Vaping status: Former  Substance and Sexual Activity   Alcohol use: Not Currently    Alcohol/week: 1.0 standard drink of alcohol    Types: 1 Standard drinks or equivalent per week    Comment: Last ETOH use 07/19/20   Drug use: Not Currently    Types: Marijuana    Comment: Last marijuana use Christmas 07/19/20.   Sexual activity: Yes    Partners: Male    Birth control/protection: Condom  Other Topics Concern   Not on file  Social History Narrative   Not on file   Social Determinants of Health  Financial Resource Strain: Not on file  Food Insecurity: Not on file  Transportation Needs: Not on file  Physical Activity: Not on file  Stress: Not on file  Social Connections: Not on file  Intimate Partner Violence: Not on file    FAMILY HISTORY: Family History  Problem Relation Age of Onset   ADD / ADHD Brother    ADD / ADHD Brother    Cancer Maternal Grandmother        Ovarian cancer    ALLERGIES:  is allergic to estrogens and gramineae pollens.  MEDICATIONS:  Current Outpatient Medications  Medication Sig Dispense Refill   albuterol (PROVENTIL HFA;VENTOLIN HFA) 108 (90 Base) MCG/ACT inhaler Inhale 2 puffs into the lungs every 6 (six) hours as needed for wheezing or shortness of breath. 1 Inhaler 0   aspirin 81 MG chewable tablet Chew 81  mg by mouth daily.     enoxaparin (LOVENOX) 100 MG/ML injection Inject 100 mg into the skin daily.     fluticasone-salmeterol (ADVAIR) 250-50 MCG/ACT AEPB Inhale 1 puff into the lungs in the morning and at bedtime.     Prenatal Vit-Fe Fumarate-FA (PRENATAL MULTIVITAMIN) TABS tablet Take 1 tablet by mouth daily at 12 noon.     BAC 50-325-40 MG tablet Take 1-2 tablets by mouth every 4 (four) hours as needed. (Patient not taking: Reported on 08/30/2022)     montelukast (SINGULAIR) 10 MG tablet Take 10 mg by mouth daily. (Patient not taking: Reported on 02/07/2023)     omeprazole (PRILOSEC) 20 MG capsule Take 20 mg by mouth daily. (Patient not taking: Reported on 02/28/2023)     No current facility-administered medications for this visit.    Review of Systems  Constitutional:  Positive for fatigue. Negative for appetite change, chills and fever.  HENT:   Negative for hearing loss and voice change.   Eyes:  Negative for eye problems.  Respiratory:  Negative for chest tightness and cough.   Cardiovascular:  Negative for chest pain.  Gastrointestinal:  Negative for abdominal distention, abdominal pain and blood in stool.  Endocrine: Negative for hot flashes.  Genitourinary:  Negative for difficulty urinating and frequency.   Musculoskeletal:  Negative for arthralgias.  Skin:  Negative for itching and rash.  Neurological:  Negative for extremity weakness.  Hematological:  Negative for adenopathy.  Psychiatric/Behavioral:  Negative for confusion.     PHYSICAL EXAMINATION: ECOG PERFORMANCE STATUS: 1 - Symptomatic but completely ambulatory Vitals:   02/28/23 1402  BP: 108/63  Pulse: 88  Resp: 18  Temp: 98 F (36.7 C)  SpO2: 97%   Filed Weights   02/28/23 1402  Weight: 249 lb 12.8 oz (113.3 kg)    Physical Exam Constitutional:      General: She is not in acute distress. HENT:     Head: Normocephalic and atraumatic.  Eyes:     General: No scleral icterus. Cardiovascular:     Rate and  Rhythm: Normal rate and regular rhythm.  Pulmonary:     Effort: Pulmonary effort is normal. No respiratory distress.  Abdominal:     Comments: Gravid uterus.   Musculoskeletal:        General: Normal range of motion.     Cervical back: Normal range of motion and neck supple.  Skin:    General: Skin is warm and dry.     Findings: No erythema or rash.  Neurological:     Mental Status: She is alert and oriented to person, place, and time. Mental status  is at baseline.     Coordination: Coordination normal.  Psychiatric:        Mood and Affect: Mood normal.      LABORATORY DATA:  I have reviewed the data as listed    Latest Ref Rng & Units 02/28/2023    1:53 PM 08/27/2022    4:02 PM 04/26/2022    3:44 PM  CBC  WBC 4.0 - 10.5 K/uL 10.4  8.8  9.3   Hemoglobin 12.0 - 15.0 g/dL 29.5  62.1  9.7   Hematocrit 36.0 - 46.0 % 34.4  41.3  32.7   Platelets 150 - 400 K/uL 297  321  411       Latest Ref Rng & Units 04/26/2022    3:44 PM 10/05/2019   12:47 AM 09/22/2015    4:49 AM  CMP  Glucose 70 - 99 mg/dL 308  657  99   BUN 6 - 20 mg/dL 6  7  <5   Creatinine 8.46 - 1.00 mg/dL 9.62  9.52  8.41   Sodium 135 - 145 mmol/L 135  136  136   Potassium 3.5 - 5.1 mmol/L 3.6  3.6  3.5   Chloride 98 - 111 mmol/L 105  104  107   CO2 22 - 32 mmol/L 26  20  22    Calcium 8.9 - 10.3 mg/dL 9.0  8.5  7.8   Total Protein 6.5 - 8.1 g/dL 8.5  7.2    Total Bilirubin 0.3 - 1.2 mg/dL 0.4  0.4    Alkaline Phos 38 - 126 U/L 75  67    AST 15 - 41 U/L 16  22    ALT 0 - 44 U/L 11  12        Component Value Date/Time   IRON 43 02/28/2023 1353   TIBC 553 (H) 02/28/2023 1353   FERRITIN 12 02/28/2023 1353   IRONPCTSAT 8 (L) 02/28/2023 1353     RADIOGRAPHIC STUDIES: I have personally reviewed the radiological images as listed and agreed with the findings in the report. [place] MFM OB FOLLOW UP  Result Date: 02/07/2023 ----------------------------------------------------------------------  OBSTETRICS REPORT                        (Signed Final 02/07/2023 04:02 pm) ---------------------------------------------------------------------- Patient Info  ID #:       [id number]                          D.O.B.:  1996-03-26 (27 yrs)  Name:       [facility]                     Visit Date: 02/07/2023 02:53 pm ---------------------------------------------------------------------- Performed By  Attending:        Noralee Space MD        Referred By:      Judson Roch                                                             BENDER  Performed By:     Reinaldo Raddle            [facility]                                                             [facility] ---------------------------------------------------------------------- Orders  #  Description                           Code        Ordered By  1  [place] MFM OB FOLLOW UP                   [phone]    [facility] ----------------------------------------------------------------------  #  Order #                     Accession #                Episode #  1  [id number]                   [id number]                 [id number] ---------------------------------------------------------------------- Indications  History of Pulmonary embolism affecting        O88.219  pregnancy (Lovenox)  Obesity complicating pregnancy, second         O99.212  trimester (BMI 43)  Abnormal biochemical screen (quad) for         O28.9  Trisomy 21  [redacted] weeks gestation of pregnancy                Z3A.23  LR NIPS, Neg [place] ---------------------------------------------------------------------- Fetal Evaluation  Num Of Fetuses:         1  Fetal Heart Rate(bpm):  144  Cardiac Activity:       Observed  Presentation:           Cephalic  Placenta:               Anterior  P. Cord Insertion:      Previously visualized  Amniotic Fluid  AFI FV:      Within normal limits                              Largest Pocket(cm)                               4.98 ---------------------------------------------------------------------- Biometry  BPD:     58.29  mm     G. Age:  23w 6d         56  %    CI:         75.8   %    70 - 86  FL/HC:      20.3   %    18.7 - 20.9  HC:    212.24   mm     G. Age:  23w 2d         23  %    HC/AC:      1.11        1.05 - 1.21  AC:    191.53   mm     G. Age:  23w 6d         51  %    FL/BPD:     73.8   %    71 - 87  FL:      43.03  mm     G. Age:  24w 1d         55  %    FL/AC:      22.5   %    20 - 24  HUM:        39  mm     G. Age:  23w 6d         49  %  LV:        4.3  mm  Est. FW:     640  gm      1 lb 7 oz     58  % ---------------------------------------------------------------------- Gestational Age  LMP:           23w 4d        Date:  08/26/22                   EDD:   06/02/23  U/S Today:     23w 6d                                        EDD:   05/31/23  Best:          23w 4d     Det. By:  LMP  (08/26/22)          EDD:   06/02/23 ---------------------------------------------------------------------- Anatomy  Cranium:               Appears normal         Aortic Arch:            Appears normal  Cavum:                 Appears normal         Ductal Arch:            Appears normal  Ventricles:            Appears normal         Diaphragm:              Appears normal  Choroid Plexus:        Previously seen        Stomach:                Appears normal, left  sided  Cerebellum:            Previously seen        Abdomen:                Appears normal  Posterior Fossa:       Previously seen        Abdominal Wall:         Previously seen  Nuchal Fold:           Previously seen        Cord Vessels:           Previously seen  Face:                  Profile nl; orbits     Kidneys:                Appear normal                         prev visualized  Lips:                  Appears normal         Bladder:                 Appears normal  Thoracic:              Appears normal         Spine:                  Previously seen  Heart:                 Previously seen        Upper Extremities:      Previously seen  RVOT:                  Appears normal         Lower Extremities:      Previously seen  LVOT:                  Appears normal  Other:  Technically difficult due to maternal habitus and fetal position. ---------------------------------------------------------------------- Cervix Uterus Adnexa  Cervix  Length:            3.4  cm.  Normal appearance by transabdominal scan  Uterus  No abnormality visualized.  Right Ovary  Size(cm)     1.79   x   1.28   x  1.12      Vol(ml): 1.34  Within normal limits.  Left Ovary  Size(cm)     1.68   x   1.45   x  0.95      Vol(ml): 1.21  Within normal limits.  Cul De Sac  No free fluid seen.  Adnexa  No abnormality visualized ---------------------------------------------------------------------- Impression  Increased risk for Down syndrome on serum screening.  Subsequently, patient had cell free fetal DNA screening that  showed low risk for Down syndrome and other aneuploidies  screened.  History of pulmonary embolism.  Patient takes prophylactic  Lovenox 100 mg once daily.  Fetal growth is appropriate for gestational age.  Amniotic fluid  is normal good fetal activity seen.  Fetal anatomical survey  was completed (except 3VV and 3VT) and appears normal.  As maternal obesity limits resolution of images, failure to  detect anomalies are more common . ---------------------------------------------------------------------- Recommendations  -An appointment was made for her to return in 5  weeks and 9  weeks for fetal growth assessment.  -Weekly BPP or NST from 34 weeks' gestation till delivery.  -Consider switching to unfractionated heparin 10,000 units  twice daily at 19 or 37 weeks' gestation. ----------------------------------------------------------------------                 Noralee Space, MD  Electronically Signed Final Report   02/07/2023 04:02 pm ----------------------------------------------------------------------

## 2023-03-04 ENCOUNTER — Telehealth: Payer: Self-pay

## 2023-03-04 NOTE — Telephone Encounter (Signed)
Called patient and informed her that based on her lab results Dr. Cathie Hoops would like for her to get IV Venofer treatment weekly x 2 treatments. Patient is agreeable to this plan so I will have Morrie Sheldon schedule and notify patient of dates and times.

## 2023-03-04 NOTE — Telephone Encounter (Signed)
-----   Message from Rickard Patience sent at 03/03/2023 10:04 PM EDT ----- Recommend patient to get Venofer weekly x 2

## 2023-03-08 ENCOUNTER — Other Ambulatory Visit: Payer: Self-pay

## 2023-03-09 ENCOUNTER — Other Ambulatory Visit: Payer: Self-pay

## 2023-03-09 DIAGNOSIS — O280 Abnormal hematological finding on antenatal screening of mother: Secondary | ICD-10-CM

## 2023-03-09 DIAGNOSIS — Z86711 Personal history of pulmonary embolism: Secondary | ICD-10-CM

## 2023-03-09 DIAGNOSIS — O99212 Obesity complicating pregnancy, second trimester: Secondary | ICD-10-CM

## 2023-03-11 ENCOUNTER — Inpatient Hospital Stay: Payer: BC Managed Care – PPO

## 2023-03-11 VITALS — BP 114/68 | HR 90 | Temp 98.7°F

## 2023-03-11 DIAGNOSIS — D500 Iron deficiency anemia secondary to blood loss (chronic): Secondary | ICD-10-CM

## 2023-03-11 DIAGNOSIS — D509 Iron deficiency anemia, unspecified: Secondary | ICD-10-CM | POA: Diagnosis not present

## 2023-03-11 MED ORDER — SODIUM CHLORIDE 0.9 % IV SOLN
200.0000 mg | Freq: Once | INTRAVENOUS | Status: AC
  Administered 2023-03-11: 200 mg via INTRAVENOUS
  Filled 2023-03-11: qty 200

## 2023-03-11 MED ORDER — SODIUM CHLORIDE 0.9 % IV SOLN
INTRAVENOUS | Status: DC | PRN
  Filled 2023-03-11: qty 250

## 2023-03-14 ENCOUNTER — Ambulatory Visit: Payer: BC Managed Care – PPO | Attending: Obstetrics

## 2023-03-14 ENCOUNTER — Other Ambulatory Visit: Payer: Self-pay

## 2023-03-14 DIAGNOSIS — E669 Obesity, unspecified: Secondary | ICD-10-CM | POA: Diagnosis not present

## 2023-03-14 DIAGNOSIS — Z86711 Personal history of pulmonary embolism: Secondary | ICD-10-CM | POA: Insufficient documentation

## 2023-03-14 DIAGNOSIS — Z3A28 28 weeks gestation of pregnancy: Secondary | ICD-10-CM | POA: Diagnosis not present

## 2023-03-14 DIAGNOSIS — O280 Abnormal hematological finding on antenatal screening of mother: Secondary | ICD-10-CM | POA: Insufficient documentation

## 2023-03-14 DIAGNOSIS — O99213 Obesity complicating pregnancy, third trimester: Secondary | ICD-10-CM | POA: Diagnosis not present

## 2023-03-14 DIAGNOSIS — O99212 Obesity complicating pregnancy, second trimester: Secondary | ICD-10-CM | POA: Diagnosis present

## 2023-03-14 DIAGNOSIS — O88213 Thromboembolism in pregnancy, third trimester: Secondary | ICD-10-CM | POA: Diagnosis not present

## 2023-03-14 DIAGNOSIS — Z7901 Long term (current) use of anticoagulants: Secondary | ICD-10-CM | POA: Insufficient documentation

## 2023-03-14 DIAGNOSIS — O24415 Gestational diabetes mellitus in pregnancy, controlled by oral hypoglycemic drugs: Secondary | ICD-10-CM | POA: Insufficient documentation

## 2023-03-14 DIAGNOSIS — O285 Abnormal chromosomal and genetic finding on antenatal screening of mother: Secondary | ICD-10-CM

## 2023-03-18 ENCOUNTER — Inpatient Hospital Stay: Payer: BC Managed Care – PPO

## 2023-03-18 VITALS — BP 106/62 | HR 95 | Temp 98.9°F | Resp 18

## 2023-03-18 DIAGNOSIS — D509 Iron deficiency anemia, unspecified: Secondary | ICD-10-CM | POA: Diagnosis not present

## 2023-03-18 DIAGNOSIS — D500 Iron deficiency anemia secondary to blood loss (chronic): Secondary | ICD-10-CM

## 2023-03-18 MED ORDER — SODIUM CHLORIDE 0.9 % IV SOLN
200.0000 mg | Freq: Once | INTRAVENOUS | Status: AC
  Administered 2023-03-18: 200 mg via INTRAVENOUS
  Filled 2023-03-18: qty 200

## 2023-03-18 MED ORDER — SODIUM CHLORIDE 0.9 % IV SOLN
INTRAVENOUS | Status: DC | PRN
  Filled 2023-03-18: qty 250

## 2023-04-07 ENCOUNTER — Other Ambulatory Visit: Payer: Self-pay

## 2023-04-07 DIAGNOSIS — O99213 Obesity complicating pregnancy, third trimester: Secondary | ICD-10-CM

## 2023-04-07 DIAGNOSIS — Z86711 Personal history of pulmonary embolism: Secondary | ICD-10-CM

## 2023-04-07 DIAGNOSIS — O280 Abnormal hematological finding on antenatal screening of mother: Secondary | ICD-10-CM

## 2023-04-07 DIAGNOSIS — O281 Abnormal biochemical finding on antenatal screening of mother: Secondary | ICD-10-CM

## 2023-04-07 DIAGNOSIS — O99212 Obesity complicating pregnancy, second trimester: Secondary | ICD-10-CM

## 2023-04-11 ENCOUNTER — Ambulatory Visit: Payer: BC Managed Care – PPO

## 2023-04-11 ENCOUNTER — Other Ambulatory Visit: Payer: Self-pay

## 2023-04-11 ENCOUNTER — Ambulatory Visit: Payer: BC Managed Care – PPO | Attending: Maternal & Fetal Medicine

## 2023-04-11 VITALS — BP 120/70 | HR 113 | Temp 97.5°F | Ht 64.0 in | Wt 253.5 lb

## 2023-04-11 DIAGNOSIS — Z86711 Personal history of pulmonary embolism: Secondary | ICD-10-CM | POA: Diagnosis not present

## 2023-04-11 DIAGNOSIS — O280 Abnormal hematological finding on antenatal screening of mother: Secondary | ICD-10-CM | POA: Diagnosis present

## 2023-04-11 DIAGNOSIS — E669 Obesity, unspecified: Secondary | ICD-10-CM

## 2023-04-11 DIAGNOSIS — Z86718 Personal history of other venous thrombosis and embolism: Secondary | ICD-10-CM

## 2023-04-11 DIAGNOSIS — Z3A32 32 weeks gestation of pregnancy: Secondary | ICD-10-CM | POA: Diagnosis not present

## 2023-04-11 DIAGNOSIS — O99213 Obesity complicating pregnancy, third trimester: Secondary | ICD-10-CM

## 2023-04-11 DIAGNOSIS — O281 Abnormal biochemical finding on antenatal screening of mother: Secondary | ICD-10-CM

## 2023-04-11 DIAGNOSIS — O24414 Gestational diabetes mellitus in pregnancy, insulin controlled: Secondary | ICD-10-CM

## 2023-04-11 DIAGNOSIS — O88213 Thromboembolism in pregnancy, third trimester: Secondary | ICD-10-CM | POA: Insufficient documentation

## 2023-04-11 DIAGNOSIS — O289 Unspecified abnormal findings on antenatal screening of mother: Secondary | ICD-10-CM | POA: Insufficient documentation

## 2023-04-12 DIAGNOSIS — O24414 Gestational diabetes mellitus in pregnancy, insulin controlled: Secondary | ICD-10-CM | POA: Insufficient documentation

## 2023-04-18 ENCOUNTER — Ambulatory Visit: Payer: BC Managed Care – PPO | Attending: Maternal & Fetal Medicine

## 2023-04-18 ENCOUNTER — Other Ambulatory Visit: Payer: Self-pay

## 2023-04-18 VITALS — BP 125/60 | HR 91 | Temp 97.8°F | Ht 64.0 in | Wt 254.0 lb

## 2023-04-18 DIAGNOSIS — E669 Obesity, unspecified: Secondary | ICD-10-CM

## 2023-04-18 DIAGNOSIS — O99213 Obesity complicating pregnancy, third trimester: Secondary | ICD-10-CM | POA: Diagnosis not present

## 2023-04-18 DIAGNOSIS — Z3A33 33 weeks gestation of pregnancy: Secondary | ICD-10-CM

## 2023-04-18 DIAGNOSIS — O281 Abnormal biochemical finding on antenatal screening of mother: Secondary | ICD-10-CM | POA: Diagnosis not present

## 2023-04-18 DIAGNOSIS — Z86718 Personal history of other venous thrombosis and embolism: Secondary | ICD-10-CM

## 2023-04-18 DIAGNOSIS — O24414 Gestational diabetes mellitus in pregnancy, insulin controlled: Secondary | ICD-10-CM | POA: Diagnosis not present

## 2023-04-20 ENCOUNTER — Other Ambulatory Visit: Payer: Self-pay | Admitting: Maternal & Fetal Medicine

## 2023-04-20 DIAGNOSIS — O99213 Obesity complicating pregnancy, third trimester: Secondary | ICD-10-CM

## 2023-04-25 ENCOUNTER — Other Ambulatory Visit: Payer: Self-pay | Admitting: Obstetrics

## 2023-04-25 ENCOUNTER — Ambulatory Visit: Payer: BC Managed Care – PPO | Attending: Maternal & Fetal Medicine

## 2023-04-25 ENCOUNTER — Other Ambulatory Visit: Payer: Self-pay

## 2023-04-25 DIAGNOSIS — Z3A34 34 weeks gestation of pregnancy: Secondary | ICD-10-CM | POA: Insufficient documentation

## 2023-04-25 DIAGNOSIS — E669 Obesity, unspecified: Secondary | ICD-10-CM

## 2023-04-25 DIAGNOSIS — O281 Abnormal biochemical finding on antenatal screening of mother: Secondary | ICD-10-CM

## 2023-04-25 DIAGNOSIS — O24414 Gestational diabetes mellitus in pregnancy, insulin controlled: Secondary | ICD-10-CM | POA: Insufficient documentation

## 2023-04-25 DIAGNOSIS — O99213 Obesity complicating pregnancy, third trimester: Secondary | ICD-10-CM | POA: Insufficient documentation

## 2023-04-25 DIAGNOSIS — O289 Unspecified abnormal findings on antenatal screening of mother: Secondary | ICD-10-CM | POA: Insufficient documentation

## 2023-04-25 DIAGNOSIS — Z86718 Personal history of other venous thrombosis and embolism: Secondary | ICD-10-CM

## 2023-04-25 DIAGNOSIS — O24913 Unspecified diabetes mellitus in pregnancy, third trimester: Secondary | ICD-10-CM

## 2023-04-25 DIAGNOSIS — O24419 Gestational diabetes mellitus in pregnancy, unspecified control: Secondary | ICD-10-CM | POA: Diagnosis present

## 2023-04-25 DIAGNOSIS — Z86711 Personal history of pulmonary embolism: Secondary | ICD-10-CM | POA: Insufficient documentation

## 2023-05-02 ENCOUNTER — Ambulatory Visit: Payer: BC Managed Care – PPO | Attending: Maternal & Fetal Medicine

## 2023-05-02 ENCOUNTER — Other Ambulatory Visit: Payer: Self-pay

## 2023-05-02 ENCOUNTER — Inpatient Hospital Stay: Payer: BC Managed Care – PPO | Attending: Oncology

## 2023-05-02 DIAGNOSIS — O3513X0 Maternal care for (suspected) chromosomal abnormality in fetus, trisomy 21, not applicable or unspecified: Secondary | ICD-10-CM

## 2023-05-02 DIAGNOSIS — O289 Unspecified abnormal findings on antenatal screening of mother: Secondary | ICD-10-CM | POA: Insufficient documentation

## 2023-05-02 DIAGNOSIS — O99213 Obesity complicating pregnancy, third trimester: Secondary | ICD-10-CM | POA: Insufficient documentation

## 2023-05-02 DIAGNOSIS — Z3A35 35 weeks gestation of pregnancy: Secondary | ICD-10-CM | POA: Insufficient documentation

## 2023-05-02 DIAGNOSIS — O24414 Gestational diabetes mellitus in pregnancy, insulin controlled: Secondary | ICD-10-CM | POA: Diagnosis not present

## 2023-05-02 DIAGNOSIS — Z7901 Long term (current) use of anticoagulants: Secondary | ICD-10-CM | POA: Diagnosis not present

## 2023-05-02 DIAGNOSIS — D509 Iron deficiency anemia, unspecified: Secondary | ICD-10-CM | POA: Diagnosis present

## 2023-05-02 DIAGNOSIS — D500 Iron deficiency anemia secondary to blood loss (chronic): Secondary | ICD-10-CM

## 2023-05-02 DIAGNOSIS — E669 Obesity, unspecified: Secondary | ICD-10-CM

## 2023-05-02 DIAGNOSIS — Z86711 Personal history of pulmonary embolism: Secondary | ICD-10-CM | POA: Insufficient documentation

## 2023-05-02 DIAGNOSIS — O24913 Unspecified diabetes mellitus in pregnancy, third trimester: Secondary | ICD-10-CM

## 2023-05-02 LAB — CBC WITH DIFFERENTIAL (CANCER CENTER ONLY)
Abs Immature Granulocytes: 0.02 10*3/uL (ref 0.00–0.07)
Basophils Absolute: 0 10*3/uL (ref 0.0–0.1)
Basophils Relative: 0 %
Eosinophils Absolute: 0 10*3/uL (ref 0.0–0.5)
Eosinophils Relative: 0 %
HCT: 32.9 % — ABNORMAL LOW (ref 36.0–46.0)
Hemoglobin: 11.1 g/dL — ABNORMAL LOW (ref 12.0–15.0)
Immature Granulocytes: 0 %
Lymphocytes Relative: 27 %
Lymphs Abs: 2.5 10*3/uL (ref 0.7–4.0)
MCH: 27.3 pg (ref 26.0–34.0)
MCHC: 33.7 g/dL (ref 30.0–36.0)
MCV: 80.8 fL (ref 80.0–100.0)
Monocytes Absolute: 0.4 10*3/uL (ref 0.1–1.0)
Monocytes Relative: 5 %
Neutro Abs: 6.1 10*3/uL (ref 1.7–7.7)
Neutrophils Relative %: 68 %
Platelet Count: 236 10*3/uL (ref 150–400)
RBC: 4.07 MIL/uL (ref 3.87–5.11)
RDW: 14.7 % (ref 11.5–15.5)
WBC Count: 9 10*3/uL (ref 4.0–10.5)
nRBC: 0 % (ref 0.0–0.2)

## 2023-05-02 LAB — IRON AND TIBC
Iron: 34 ug/dL (ref 28–170)
Saturation Ratios: 6 % — ABNORMAL LOW (ref 10.4–31.8)
TIBC: 615 ug/dL — ABNORMAL HIGH (ref 250–450)
UIBC: 581 ug/dL

## 2023-05-02 LAB — FERRITIN: Ferritin: 6 ng/mL — ABNORMAL LOW (ref 11–307)

## 2023-05-04 ENCOUNTER — Inpatient Hospital Stay (HOSPITAL_BASED_OUTPATIENT_CLINIC_OR_DEPARTMENT_OTHER): Payer: BC Managed Care – PPO | Admitting: Oncology

## 2023-05-04 ENCOUNTER — Ambulatory Visit: Payer: BC Managed Care – PPO

## 2023-05-04 ENCOUNTER — Encounter: Payer: Self-pay | Admitting: Oncology

## 2023-05-04 ENCOUNTER — Inpatient Hospital Stay: Payer: BC Managed Care – PPO

## 2023-05-04 ENCOUNTER — Ambulatory Visit: Payer: BC Managed Care – PPO | Admitting: Oncology

## 2023-05-04 VITALS — BP 111/69 | HR 100 | Temp 97.3°F | Resp 18 | Wt 258.0 lb

## 2023-05-04 VITALS — BP 103/56 | HR 93 | Temp 99.7°F | Resp 19

## 2023-05-04 DIAGNOSIS — Z86711 Personal history of pulmonary embolism: Secondary | ICD-10-CM | POA: Diagnosis not present

## 2023-05-04 DIAGNOSIS — D509 Iron deficiency anemia, unspecified: Secondary | ICD-10-CM | POA: Diagnosis not present

## 2023-05-04 DIAGNOSIS — D500 Iron deficiency anemia secondary to blood loss (chronic): Secondary | ICD-10-CM

## 2023-05-04 MED ORDER — SODIUM CHLORIDE 0.9 % IV SOLN
INTRAVENOUS | Status: DC
  Filled 2023-05-04: qty 250

## 2023-05-04 MED ORDER — SODIUM CHLORIDE 0.9 % IV SOLN
200.0000 mg | Freq: Once | INTRAVENOUS | Status: AC
  Administered 2023-05-04: 200 mg via INTRAVENOUS
  Filled 2023-05-04: qty 200

## 2023-05-04 NOTE — Assessment & Plan Note (Addendum)
History of pulmonary embolism in 2017 due to OCP Currently in third trimester. She has been on prophylactic anticoagulation with Lovenox 100 mg daily, -not managed by me. Given gestational weight gain, recommend patient to further discuss with her OB/GYN to consider increase of Lovenox to 1.5 mg/kg She will also need postpartum anticoagulation for 6 weeks Patient plans to call her OB

## 2023-05-04 NOTE — Assessment & Plan Note (Addendum)
She tolerate Venofer treatments.  Labs are reviewed and discussed with patient. Lab Results  Component Value Date   HGB 11.1 (L) 05/02/2023   TIBC 615 (H) 05/02/2023   IRONPCTSAT 6 (L) 05/02/2023   FERRITIN 6 (L) 05/02/2023    Recommend IV venofer weekly x 4

## 2023-05-04 NOTE — Progress Notes (Signed)
Hematology/Oncology Consult note Telephone:([phone] Fax:([phone]    CHIEF COMPLAINTS/REASON FOR VISIT:  Anemia  ASSESSMENT & PLAN:   IDA (iron deficiency anemia) She tolerate Venofer treatments.  Labs are reviewed and discussed with patient. Lab Results  Component Value Date   HGB 11.1 (L) 05/02/2023   TIBC 615 (H) 05/02/2023   IRONPCTSAT 6 (L) 05/02/2023   FERRITIN 6 (L) 05/02/2023    Recommend IV venofer weekly x 4  History of pulmonary embolism History of pulmonary embolism in 2017 due to OCP Currently in third trimester. She has been on prophylactic anticoagulation with Lovenox 100 mg daily, -not managed by me. Given gestational weight gain, recommend patient to further discuss with her OB/GYN to consider increase of Lovenox to 1.5 mg/kg She will also need postpartum anticoagulation for 6 weeks Patient plans to call her OB    Orders Placed This Encounter  Procedures   CBC with Differential (Cancer Center Only)    Standing Status:   Future    Standing Expiration Date:   05/03/2024   Iron and TIBC    Standing Status:   Future    Standing Expiration Date:   05/03/2024   Ferritin    Standing Status:   Future    Standing Expiration Date:   05/03/2024   Retic Panel    Standing Status:   Future    Standing Expiration Date:   05/03/2024   Follow up in 4 months.  All questions were answered. The patient knows to call the clinic with any problems, questions or concerns.  Rickard Patience, MD, PhD [facility] Hematology Oncology 05/04/2023     HISTORY OF PRESENTING ILLNESS:  Carmen Rivers is a  27 y.o.  female with PMH listed below who was referred to me for anemia Reviewed patient's recent labs that was done.  She was found to have abnormal CBC with Hb of 9.5, microcytic.  + heavy menstrual period, + fatigue.  History of PE due to being on OCP She denies recent chest pain on exertion, shortness of breath on minimal exertion, pre-syncopal episodes, or  palpitations She had not noticed any recent bleeding such as epistaxis, hematuria or hematochezia.  She denies over the counter NSAID ingestion.  She has tried different types of oral iron supplementation and she is not able to tolerate due to nausea.   INTERVAL HISTORY Carmen Rivers is a 27 y.o. female who has above history reviewed by me today presents for follow up visit for iron deficiency anemia.  Patient is currently in her third trimester due date is in Nov 2024. She reports feeling well.  Chronic fatigue. She tolerates IV Venofer treatments previously. Patient is on aspirin 81 mg as well as Lovenox 100 mg daily for anticoagulation prophylaxis during pregnancy due to history of PE on OCP.  MEDICAL HISTORY:  Past Medical History:  Diagnosis Date   Anemia    Asthma    Gastritis    Reactive airway disease    Seasonal allergies    Seizures (HCC)     SURGICAL HISTORY: Past Surgical History:  Procedure Laterality Date   NO PAST SURGERIES      SOCIAL HISTORY: Social History   Socioeconomic History   Marital status: Single    Spouse name: Natasha Bence   Number of children: 0   Years of education: Not on file   Highest education level: Bachelor's degree (e.g., BA, AB, BS)  Occupational History   Occupation: Teacher    Comment: [employer]  Tobacco Use   Smoking status: Former    Types: E-cigarettes   Smokeless tobacco: Never  Vaping Use   Vaping status: Former  Substance and Sexual Activity   Alcohol use: Not Currently    Alcohol/week: 1.0 standard drink of alcohol    Types: 1 Standard drinks or equivalent per week    Comment: Last ETOH use 07/19/20   Drug use: Not Currently    Types: Marijuana    Comment: Last marijuana use Christmas 07/19/20.   Sexual activity: Yes    Partners: Male    Birth control/protection: Condom  Other Topics Concern   Not on file  Social History Narrative   Not on file   Social Determinants of Health   Financial Resource  Strain: Not on file  Food Insecurity: Not on file  Transportation Needs: Not on file  Physical Activity: Not on file  Stress: Not on file  Social Connections: Not on file  Intimate Partner Violence: Not on file    FAMILY HISTORY: Family History  Problem Relation Age of Onset   ADD / ADHD Brother    ADD / ADHD Brother    Cancer Maternal Grandmother        Ovarian cancer    ALLERGIES:  is allergic to estrogens and gramineae pollens.  MEDICATIONS:  Current Outpatient Medications  Medication Sig Dispense Refill   albuterol (PROVENTIL HFA;VENTOLIN HFA) 108 (90 Base) MCG/ACT inhaler Inhale 2 puffs into the lungs every 6 (six) hours as needed for wheezing or shortness of breath. 1 Inhaler 0   aspirin 81 MG chewable tablet Chew 81 mg by mouth daily.     enoxaparin (LOVENOX) 100 MG/ML injection Inject 100 mg into the skin daily.     fluticasone-salmeterol (ADVAIR) 250-50 MCG/ACT AEPB Inhale 1 puff into the lungs in the morning and at bedtime.     folic acid (FOLVITE) 800 MCG tablet Take 800 mcg by mouth daily.     insulin glargine (LANTUS) 100 UNIT/ML injection Inject 10 Units into the skin at bedtime.     montelukast (SINGULAIR) 10 MG tablet Take 10 mg by mouth daily.     BAC 50-325-40 MG tablet Take 1-2 tablets by mouth every 4 (four) hours as needed. (Patient not taking: Reported on 08/30/2022)     omeprazole (PRILOSEC) 20 MG capsule Take 20 mg by mouth daily. (Patient not taking: Reported on 02/28/2023)     Prenatal Vit-Fe Fumarate-FA (PRENATAL MULTIVITAMIN) TABS tablet Take 1 tablet by mouth daily at 12 noon. (Patient not taking: Reported on 04/11/2023)     No current facility-administered medications for this visit.    Review of Systems  Constitutional:  Positive for fatigue. Negative for appetite change, chills and fever.  HENT:   Negative for hearing loss and voice change.   Eyes:  Negative for eye problems.  Respiratory:  Negative for chest tightness and cough.   Cardiovascular:   Negative for chest pain.  Gastrointestinal:  Negative for abdominal distention, abdominal pain and blood in stool.  Endocrine: Negative for hot flashes.  Genitourinary:  Negative for difficulty urinating and frequency.   Musculoskeletal:  Negative for arthralgias.  Skin:  Negative for itching and rash.  Neurological:  Negative for extremity weakness.  Hematological:  Negative for adenopathy.  Psychiatric/Behavioral:  Negative for confusion.     PHYSICAL EXAMINATION: ECOG PERFORMANCE STATUS: 1 - Symptomatic but completely ambulatory Vitals:   05/04/23 1319  BP: 111/69  Pulse: 100  Resp: 18  Temp: (!) 97.3 F (36.3  C)  SpO2: 95%   Filed Weights   05/04/23 1319  Weight: 258 lb (117 kg)    Physical Exam Constitutional:      General: She is not in acute distress. HENT:     Head: Normocephalic and atraumatic.  Eyes:     General: No scleral icterus. Cardiovascular:     Rate and Rhythm: Normal rate and regular rhythm.  Pulmonary:     Effort: Pulmonary effort is normal. No respiratory distress.  Abdominal:     Comments: Gravid uterus.   Musculoskeletal:        General: Normal range of motion.     Cervical back: Normal range of motion and neck supple.  Skin:    General: Skin is warm and dry.     Findings: No erythema or rash.  Neurological:     Mental Status: She is alert and oriented to person, place, and time. Mental status is at baseline.     Coordination: Coordination normal.  Psychiatric:        Mood and Affect: Mood normal.      LABORATORY DATA:  I have reviewed the data as listed    Latest Ref Rng & Units 05/02/2023    3:11 PM 02/28/2023    1:53 PM 08/27/2022    4:02 PM  CBC  WBC 4.0 - 10.5 K/uL 9.0  10.4  8.8   Hemoglobin 12.0 - 15.0 g/dL 16.1  09.6  04.5   Hematocrit 36.0 - 46.0 % 32.9  34.4  41.3   Platelets 150 - 400 K/uL 236  297  321       Latest Ref Rng & Units 04/26/2022    3:44 PM 10/05/2019   12:47 AM 09/22/2015    4:49 AM  CMP  Glucose 70 - 99  mg/dL 409  811  99   BUN 6 - 20 mg/dL 6  7  <5   Creatinine 9.14 - 1.00 mg/dL 7.82  9.56  2.13   Sodium 135 - 145 mmol/L 135  136  136   Potassium 3.5 - 5.1 mmol/L 3.6  3.6  3.5   Chloride 98 - 111 mmol/L 105  104  107   CO2 22 - 32 mmol/L 26  20  22    Calcium 8.9 - 10.3 mg/dL 9.0  8.5  7.8   Total Protein 6.5 - 8.1 g/dL 8.5  7.2    Total Bilirubin 0.3 - 1.2 mg/dL 0.4  0.4    Alkaline Phos 38 - 126 U/L 75  67    AST 15 - 41 U/L 16  22    ALT 0 - 44 U/L 11  12        Component Value Date/Time   IRON 34 05/02/2023 1511   TIBC 615 (H) 05/02/2023 1511   FERRITIN 6 (L) 05/02/2023 1511   IRONPCTSAT 6 (L) 05/02/2023 1511     RADIOGRAPHIC STUDIES: I have personally reviewed the radiological images as listed and agreed with the findings in the report. [place] MFM FETAL BPP WO NON STRESS  Result Date: 05/02/2023 ----------------------------------------------------------------------  OBSTETRICS REPORT                       (Signed Final 05/02/2023 04:41 pm) ---------------------------------------------------------------------- Patient Info  ID #:       [id number]                          D.O.B.:  1995-08-20 (27  yrs)  Name:       [facility] [place]                     Visit Date: 05/02/2023 04:29 pm ---------------------------------------------------------------------- Performed By  Attending:        Lin Landsman      Referred By:      Phineas Real                    MD                                       [facility]  Performed By:     Eden Lathe BS      [facility]                    RDMS RVT                                 Fetal Care at                                                             [facility] ---------------------------------------------------------------------- Orders  #  Description                           Code        Ordered By  1  [place] MFM FETAL BPP WO NON               76819.01    Severin Bou FANG     STRESS  ----------------------------------------------------------------------  #  Order #                     Accession #                Episode #  1  [id number]                   [id number]                 [id number] ---------------------------------------------------------------------- Indications  Gestational diabetes in pregnancy, insulin     O24.414  controlled  History of Pulmonary embolism affecting        O88.219  pregnancy (Lovenox)  Obesity complicating pregnancy, third          O99.213  trimester (pregravid BMI 38)  Abnormal biochemical screen (quad) for         O28.9  Trisomy 21  LR NIPS, Neg Horizon  [redacted] weeks gestation of pregnancy                Z3A.35 ---------------------------------------------------------------------- Fetal Evaluation  Num Of Fetuses:         1  Fetal Heart Rate(bpm):  186  Cardiac Activity:       Observed  Presentation:           Cephalic  Placenta:               Anterior  P. Cord Insertion:      Previously  seen  Amniotic Fluid  AFI FV:      Within normal limits  AFI Sum(cm)     %Tile       Largest Pocket(cm)  14.97           54          4.99  RUQ(cm)       RLQ(cm)       LUQ(cm)        LLQ(cm)  4.12          4.25          4.99           1.61 ---------------------------------------------------------------------- Biophysical Evaluation  Amniotic F.V:   Within normal limits       F. Tone:        Observed  F. Movement:    Observed                   Score:          8/8  F. Breathing:   Observed ---------------------------------------------------------------------- OB History  Gravidity:    1 ---------------------------------------------------------------------- Gestational Age  LMP:           35w 4d        Date:  08/26/22                   EDD:   06/02/23  Best:          35w 4d     Det. By:  LMP  (08/26/22)          EDD:   06/02/23 ---------------------------------------------------------------------- Anatomy  Stomach:               Appears normal, left   Bladder:                Appears normal                          sided  Kidneys:               Appear normal ---------------------------------------------------------------------- Cervix Uterus Adnexa  Cervix  Not visualized (advanced GA >24wks) ---------------------------------------------------------------------- Impression  Antenatal testing performed given maternal A2GDM  The biophysical profile was 8/8 with good fetal movement and  amniotic fluid volume. ---------------------------------------------------------------------- Recommendations  Continue serial growth and antenatal testing  Delivery between 37-39 weeks pending maternal/fetal status. ----------------------------------------------------------------------              Lin Landsman, MD Electronically Signed Final Report   05/02/2023 04:41 pm ----------------------------------------------------------------------   [place] MFM FETAL BPP WO NON STRESS  Result Date: 04/25/2023 ----------------------------------------------------------------------  OBSTETRICS REPORT                       (Signed Final 04/25/2023 04:14 pm) ---------------------------------------------------------------------- Patient Info  ID #:       [id number]                          D.O.B.:  [birth year] (27 yrs)  Name:       [facility]                     Visit Date: 04/25/2023 03:10 pm ---------------------------------------------------------------------- Performed By  Attending:        Lin Landsman      Referred By:      Phineas Real  MD                                       [facility]  Performed By:     Eden Lathe BS      [facility]         [facility]                    RDMS RVT                                 Fetal Care at                                                             [facility] ---------------------------------------------------------------------- Orders  #  Description                           Code        Ordered By  1  [place] MFM FETAL BPP WO NON               76819.01     CORENTHIAN     STRESS                                            BOOKER ----------------------------------------------------------------------  #  Order #                     Accession #                Episode #  1  [id number]                   [id number]                 [id number] ---------------------------------------------------------------------- Indications  Gestational diabetes in pregnancy, insulin     O24.414  controlled  History of Pulmonary embolism affecting        O88.219  pregnancy (Lovenox)  Obesity complicating pregnancy, third          O99.213  trimester (pregravid BMI 38)  Abnormal biochemical screen (quad) for         O28.9  Trisomy 21  LR NIPS, Neg Horizon  [redacted] weeks gestation of pregnancy                Z3A.34 ---------------------------------------------------------------------- Fetal Evaluation  Num Of Fetuses:         1  Fetal Heart Rate(bpm):  155  Cardiac Activity:       Observed  Presentation:           Cephalic  Placenta:               Anterior  P. Cord Insertion:      Previously seen  Amniotic Fluid  AFI FV:      Within normal limits  AFI Sum(cm)     %Tile       Largest Pocket(cm)  19.95  74          6.73  RUQ(cm)       RLQ(cm)       LUQ(cm)        LLQ(cm)  4.69          2.34          6.19           6.73 ---------------------------------------------------------------------- Biophysical Evaluation  Amniotic F.V:   Within normal limits       F. Tone:        Observed  F. Movement:    Observed                   Score:          8/8  F. Breathing:   Observed ---------------------------------------------------------------------- Biometry  LV:        4.7  mm ---------------------------------------------------------------------- OB History  Gravidity:    1 ---------------------------------------------------------------------- Gestational Age  LMP:           34w 4d        Date:  08/26/22                   EDD:   06/02/23  Best:          34w 4d     Det. By:  LMP  (08/26/22)          EDD:    06/02/23 ---------------------------------------------------------------------- Anatomy  Stomach:               Appears normal, left   Bladder:                Appears normal                         sided  Kidneys:               Appear normal ---------------------------------------------------------------------- Cervix Uterus Adnexa  Cervix  Not visualized (advanced GA >24wks)  Uterus  No abnormality visualized.  Right Ovary  Not visualized.  Left Ovary  Not visualized.  Cul De Sac  No free fluid seen.  Adnexa  No abnormality visualized ---------------------------------------------------------------------- Impression  Antenatal testing performed given A2GDM  The biophysical profile was 8/8 with good fetal movement and  amniotic fluid volume. ---------------------------------------------------------------------- Recommendations  Continue serial growth and weekly testing as previoulsy  scheduled. ----------------------------------------------------------------------              Lin Landsman, MD Electronically Signed Final Report   04/25/2023 04:14 pm ----------------------------------------------------------------------   [place] MFM FETAL BPP WO NON STRESS  Result Date: 04/18/2023 ----------------------------------------------------------------------  OBSTETRICS REPORT                       (Signed Final 04/18/2023 04:46 pm) ---------------------------------------------------------------------- Patient Info  ID #:       [id number]                          D.O.B.:  1996-03-25 (27 yrs)  Name:       [facility] [place]                     Visit Date: 04/18/2023 04:23 pm ---------------------------------------------------------------------- Performed By  Attending:        Lin Landsman      Referred By:      Phineas Real  MD                                       [facility]  Performed By:     Eden Lathe BS      [facility]         [facility]                    RDMS RVT                                  Fetal Care at                                                             [facility] ---------------------------------------------------------------------- Orders  #  Description                           Code        Ordered By  1  [place] MFM FETAL BPP WO NON               76819.01    CORENTHIAN     STRESS                                            BOOKER ----------------------------------------------------------------------  #  Order #                     Accession #                Episode #  1  [id number]                   [id number]                 [id number] ---------------------------------------------------------------------- Indications  Gestational diabetes in pregnancy, insulin     O24.414  controlled  History of Pulmonary embolism affecting        O88.219  pregnancy (Lovenox)  Obesity complicating pregnancy, third          O99.213  trimester (pregravid BMI 38)  Abnormal biochemical screen (quad) for         O28.9  Trisomy 21  LR NIPS, Neg Horizon  [redacted] weeks gestation of pregnancy                Z3A.33 ---------------------------------------------------------------------- Fetal Evaluation  Num Of Fetuses:         1  Fetal Heart Rate(bpm):  146  Cardiac Activity:       Observed  Presentation:           Cephalic  Placenta:               Anterior  P. Cord Insertion:      Previously seen  Amniotic Fluid  AFI FV:      Within normal limits  AFI Sum(cm)     %Tile       Largest Pocket(cm)  15.37  55          5.74  RUQ(cm)       RLQ(cm)       LUQ(cm)        LLQ(cm)  5.74          3.2           3.04           3.39 ---------------------------------------------------------------------- Biophysical Evaluation  Amniotic F.V:   Within normal limits       F. Tone:        Observed  F. Movement:    Observed                   Score:          8/8  F. Breathing:   Observed ---------------------------------------------------------------------- Biometry  LV:        2.3  mm  ---------------------------------------------------------------------- Gestational Age  LMP:           33w 4d        Date:  08/26/22                   EDD:   06/02/23  Best:          33w 4d     Det. By:  LMP  (08/26/22)          EDD:   06/02/23 ---------------------------------------------------------------------- Anatomy  Ventricles:            Appears normal         Kidneys:                Appear normal  Stomach:               Appears normal, left   Bladder:                Appears normal                         sided ---------------------------------------------------------------------- Cervix Uterus Adnexa  Cervix  Not visualized (advanced GA >24wks)  Uterus  No abnormality visualized.  Right Ovary  Not visualized.  Left Ovary  Not visualized.  Cul De Sac  No free fluid seen.  Adnexa  No abnormality visualized ---------------------------------------------------------------------- Impression  Antenatal testing performed given maternal A2GDM  The biophysical profile was 8/8 with good fetal movement and  amniotic fluid volume. ---------------------------------------------------------------------- Recommendations  Continue weekly testing and serial growth as previously  scheduled. ----------------------------------------------------------------------              Lin Landsman, MD Electronically Signed Final Report   04/18/2023 04:46 pm ----------------------------------------------------------------------   [place] MFM OB FOLLOW UP  Result Date: 04/12/2023 ----------------------------------------------------------------------  OBSTETRICS REPORT                    (Corrected Final 04/12/2023 04:19 pm) ---------------------------------------------------------------------- Patient Info  ID #:       [id number]                          D.O.B.:  12/19/1995 (27 yrs)  Name:       [facility]                     Visit Date: 04/11/2023 03:34 pm ---------------------------------------------------------------------- Performed By   Attending:        Braxton Feathers DO       Referred By:  Phineas Real                                                             [facility]  Performed By:     Eden Lathe BS      Location[facility]         [facility]                    RDMS RVT                                 Fetal Care at                                                             [facility] ---------------------------------------------------------------------- Orders  #  Description                           Code        Ordered By  1  [place] MFM OB FOLLOW UP                   76816.01    BURK SCHAIBLE  2  [place] MFM FETAL BPP WO NON               76819.01    [facility]     STRESS ----------------------------------------------------------------------  #  Order #                     Accession #                Episode #  1  [id number]                   [id number]                 [id number]  2  [id number]                   [id number]                 [id number] ---------------------------------------------------------------------- Indications  Gestational diabetes in pregnancy, insulin     O24.414  controlled  History of Pulmonary embolism affecting        O88.219  pregnancy (Lovenox)  Obesity complicating pregnancy, third          O99.213  trimester (pregravid BMI 38)  Abnormal biochemical screen (quad) for         O28.9  Trisomy 21  LR NIPS, Neg Horizon  [redacted] weeks gestation of pregnancy                Z3A.32 ---------------------------------------------------------------------- Fetal Evaluation  Num Of Fetuses:         1  Cardiac Activity:       Observed  Presentation:           Cephalic  Placenta:               Anterior  Amniotic Fluid  AFI  FV:      Within normal limits  AFI Sum(cm)     %Tile       Largest Pocket(cm)  19.53           73          7.78  RUQ(cm)       RLQ(cm)       LUQ(cm)        LLQ(cm)  0.56          7.78          4.51           6.68 ---------------------------------------------------------------------- Biophysical  Evaluation  Amniotic F.V:   Within normal limits       F. Tone:        Observed  F. Movement:    Observed                   Score:          8/8  F. Breathing:   Observed ---------------------------------------------------------------------- Biometry  BPD:     84.68  mm     G. Age:  34w 1d         84  %    CI:        72.68   %    70 - 86                                                          FL/HC:      20.6   %    19.9 - 21.5  HC:    315.86   mm     G. Age:  35w 3d         86  %    HC/AC:      1.05        0.96 - 1.11  AC:    299.57   mm     G. Age:  33w 6d         85  %    FL/BPD:     77.0   %    71 - 87  FL:      65.22  mm     G. Age:  33w 4d         67  %    FL/AC:      21.8   %    20 - 24  LV:        2.1  mm  Est. FW:    2330  gm      5 lb 2 oz     84  % ---------------------------------------------------------------------- Gestational Age  LMP:           32w 4d        Date:  08/26/22                   EDD:   06/02/23  U/S Today:     34w 2d                                        EDD:   05/21/23  Best:          Armida Sans 4d  Det. By:  LMP  (08/26/22)          EDD:   06/02/23 ---------------------------------------------------------------------- Anatomy  Cranium:               Appears normal         Aortic Arch:            Appears normal  Cavum:                 Appears normal         Ductal Arch:            Previously seen  Ventricles:            Appears normal         Diaphragm:              Previously seen  Choroid Plexus:        Previously seen        Stomach:                Appears normal, left                                                                        sided  Cerebellum:            Previously seen        Abdomen:                Appears normal  Posterior Fossa:       Previously seen        Abdominal Wall:         Previously seen  Nuchal Fold:           Previously seen        Cord Vessels:           Previously seen  Face:                  Orbits and profile     Kidneys:                Appear normal                          previously seen  Lips:                  Previously seen        Bladder:                Appears normal  Thoracic:              Appears normal         Spine:                  Previously seen  Heart:                 Previously seen        Upper Extremities:      Previously seen  RVOT:                  Previously seen        Lower Extremities:      Previously seen  LVOT:  Previously seen  Other:  3VV and 3VTV previously visualized.Technically difficult due to          maternal habitus and fetal position. ---------------------------------------------------------------------- Cervix Uterus Adnexa  Cervix  Not visualized (advanced GA >24wks)  Uterus  No abnormality visualized.  Right Ovary  Not visualized.  Left Ovary  Not visualized.  Cul De Sac  No free fluid seen.  Adnexa  No abnormality visualized ---------------------------------------------------------------------- Comments  The patient is here for a follow-up BPP and growth ultrasound  at 32w 4d. EDD: 06/02/2023 dated by LMP  (08/26/22). She  reports elevated fasting blood sugars in the low 100s, but  most of her postprandial blood sugars are well controlled.  Sonographic findings  Single intrauterine pregnancy.  Fetal cardiac activity: Observed.  Presentation: Cephalic.  Interval fetal anatomy appears normal.  Fetal biometry shows the estimated fetal weight at the 84  percentile  Amniotic fluid volume: Within normal limits. AFI: 19.53 cm.  MVP: 7.78 cm.  Placenta: Anterior.  BPP: 8/8.  Recommendations  1. Weekly antenatal testing until delivery  2. Growth ultrasounds every 4-6 weeks until delivery  3. Delivery timing pending clinical course  4. Increase insulin as needed w/ OB provider  5. Continue lovenox until 24h prior to delivery or if there is a  concern for preterm labor ----------------------------------------------------------------------                       Braxton Feathers, DO Electronically Signed Corrected Final Report   04/12/2023 04:19 pm ----------------------------------------------------------------------   [place] MFM FETAL BPP WO NON STRESS  Result Date: 04/12/2023 ----------------------------------------------------------------------  OBSTETRICS REPORT                    (Corrected Final 04/12/2023 04:19 pm) ---------------------------------------------------------------------- Patient Info  ID #:       [id number]                          D.O.B.:  March 09, 1996 (27 yrs)  Name:       [facility]                     Visit Date: 04/11/2023 03:34 pm ---------------------------------------------------------------------- Performed By  Attending:        Braxton Feathers DO       Referred By:      Phineas Real                                                             [facility]  Performed By:     Eden Lathe BS      [facility]                    RD[facility] RVT                                 [facility] at  [facility] ---------------------------------------------------------------------- Orders  #  Description                           Code        Ordered By  1  [place] MFM OB FOLLOW UP                   [phone]    Braxton Feathers  2  [place] MFM FETAL BPP WO NON               76819.01    [facility]     STRESS ----------------------------------------------------------------------  #  Order #                     Accession #                Episode #  1  [id number]                   [id number]                 [id number]  2  [id number]                   [id number]                 [id number] ---------------------------------------------------------------------- Indications  Gestational diabetes in pregnancy, insulin     O24.414  controlled  History of Pulmonary embolism affecting        O88.219  pregnancy (Lovenox)  Obesity complicating pregnancy, third          O99.213  trimester (pregravid BMI 38)  Abnormal biochemical screen (quad) for         O28.9  Trisomy 21   LR NIPS, Neg Horizon  [redacted] weeks gestation of pregnancy                Z3A.32 ---------------------------------------------------------------------- Fetal Evaluation  Num Of Fetuses:         1  Cardiac Activity:       Observed  Presentation:           Cephalic  Placenta:               Anterior  Amniotic Fluid  AFI FV:      Within normal limits  AFI Sum(cm)     %Tile       Largest Pocket(cm)  19.53           73          7.78  RUQ(cm)       RLQ(cm)       LUQ(cm)        LLQ(cm)  0.56          7.78          4.51           6.68 ---------------------------------------------------------------------- Biophysical Evaluation  Amniotic F.V:   Within normal limits       F. Tone:        Observed  F. Movement:    Observed                   Score:          8/8  F. Breathing:   Observed ---------------------------------------------------------------------- Biometry  BPD:     84.68  mm     G. Age:  34w 1d         84  %  CI:        72.68   %    70 - 86                                                          FL/HC:      20.6   %    19.9 - 21.5  HC:    315.86   mm     G. Age:  35w 3d         86  %    HC/AC:      1.05        0.96 - 1.11  AC:    299.57   mm     G. Age:  33w 6d         85  %    FL/BPD:     77.0   %    71 - 87  FL:      65.22  mm     G. Age:  33w 4d         67  %    FL/AC:      21.8   %    20 - 24  LV:        2.1  mm  Est. FW:    2330  gm      5 lb 2 oz     84  % ---------------------------------------------------------------------- Gestational Age  LMP:           32w 4d        Date:  08/26/22                   EDD:   06/02/23  U/S Today:     34w 2d                                        EDD:   05/21/23  Best:          32w 4d     Det. By:  LMP  (08/26/22)          EDD:   06/02/23 ---------------------------------------------------------------------- Anatomy  Cranium:               Appears normal         Aortic Arch:            Appears normal  Cavum:                 Appears normal         Ductal Arch:            Previously  seen  Ventricles:            Appears normal         Diaphragm:              Previously seen  Choroid Plexus:        Previously seen        Stomach:                Appears normal, left  sided  Cerebellum:            Previously seen        Abdomen:                Appears normal  Posterior Fossa:       Previously seen        Abdominal Wall:         Previously seen  Nuchal Fold:           Previously seen        Cord Vessels:           Previously seen  Face:                  Orbits and profile     Kidneys:                Appear normal                         previously seen  Lips:                  Previously seen        Bladder:                Appears normal  Thoracic:              Appears normal         Spine:                  Previously seen  Heart:                 Previously seen        Upper Extremities:      Previously seen  RVOT:                  Previously seen        Lower Extremities:      Previously seen  LVOT:                  Previously seen  Other:  3VV and 3VTV previously visualized.Technically difficult due to          maternal habitus and fetal position. ---------------------------------------------------------------------- Cervix Uterus Adnexa  Cervix  Not visualized (advanced GA >24wks)  Uterus  No abnormality visualized.  Right Ovary  Not visualized.  Left Ovary  Not visualized.  Cul De Sac  No free fluid seen.  Adnexa  No abnormality visualized ---------------------------------------------------------------------- Comments  The patient is here for a follow-up BPP and growth ultrasound  at 32w 4d. EDD: 06/02/2023 dated by LMP  (08/26/22). She  reports elevated fasting blood sugars in the low 100s, but  most of her postprandial blood sugars are well controlled.  Sonographic findings  Single intrauterine pregnancy.  Fetal cardiac activity: Observed.  Presentation: Cephalic.  Interval fetal anatomy appears normal.  Fetal biometry shows  the estimated fetal weight at the 84  percentile  Amniotic fluid volume: Within normal limits. AFI: 19.53 cm.  MVP: 7.78 cm.  Placenta: Anterior.  BPP: 8/8.  Recommendations  1. Weekly antenatal testing until delivery  2. Growth ultrasounds every 4-6 weeks until delivery  3. Delivery timing pending clinical course  4. Increase insulin as needed w/ OB provider  5. Continue lovenox until 24h prior to delivery or if there is a  concern for preterm labor ----------------------------------------------------------------------  Braxton Feathers, DO Electronically Signed Corrected Final Report  04/12/2023 04:19 pm ----------------------------------------------------------------------

## 2023-05-11 ENCOUNTER — Ambulatory Visit: Payer: BC Managed Care – PPO | Attending: Obstetrics and Gynecology

## 2023-05-11 ENCOUNTER — Other Ambulatory Visit: Payer: Self-pay

## 2023-05-11 ENCOUNTER — Other Ambulatory Visit: Payer: Self-pay | Admitting: Obstetrics and Gynecology

## 2023-05-11 DIAGNOSIS — E669 Obesity, unspecified: Secondary | ICD-10-CM

## 2023-05-11 DIAGNOSIS — O24414 Gestational diabetes mellitus in pregnancy, insulin controlled: Secondary | ICD-10-CM | POA: Insufficient documentation

## 2023-05-11 DIAGNOSIS — Z3A36 36 weeks gestation of pregnancy: Secondary | ICD-10-CM | POA: Diagnosis not present

## 2023-05-11 DIAGNOSIS — Z7901 Long term (current) use of anticoagulants: Secondary | ICD-10-CM

## 2023-05-11 DIAGNOSIS — O99213 Obesity complicating pregnancy, third trimester: Secondary | ICD-10-CM | POA: Diagnosis not present

## 2023-05-11 DIAGNOSIS — Z362 Encounter for other antenatal screening follow-up: Secondary | ICD-10-CM | POA: Diagnosis not present

## 2023-05-11 DIAGNOSIS — Z86711 Personal history of pulmonary embolism: Secondary | ICD-10-CM

## 2023-05-11 DIAGNOSIS — O3513X0 Maternal care for (suspected) chromosomal abnormality in fetus, trisomy 21, not applicable or unspecified: Secondary | ICD-10-CM

## 2023-05-11 DIAGNOSIS — O24913 Unspecified diabetes mellitus in pregnancy, third trimester: Secondary | ICD-10-CM

## 2023-05-12 ENCOUNTER — Inpatient Hospital Stay: Payer: BC Managed Care – PPO

## 2023-05-12 VITALS — BP 113/65 | HR 91 | Temp 97.6°F | Resp 18

## 2023-05-12 DIAGNOSIS — D509 Iron deficiency anemia, unspecified: Secondary | ICD-10-CM | POA: Diagnosis not present

## 2023-05-12 DIAGNOSIS — D500 Iron deficiency anemia secondary to blood loss (chronic): Secondary | ICD-10-CM

## 2023-05-12 LAB — OB RESULTS CONSOLE GBS: GBS: NEGATIVE

## 2023-05-12 MED ORDER — SODIUM CHLORIDE 0.9 % IV SOLN
INTRAVENOUS | Status: DC
  Filled 2023-05-12: qty 250

## 2023-05-12 MED ORDER — SODIUM CHLORIDE 0.9 % IV SOLN
200.0000 mg | Freq: Once | INTRAVENOUS | Status: AC
  Administered 2023-05-12: 200 mg via INTRAVENOUS
  Filled 2023-05-12: qty 200

## 2023-05-12 NOTE — Progress Notes (Signed)
Declined post-observation. Aware of risks. Vitals stable at discharge.

## 2023-05-12 NOTE — Patient Instructions (Signed)
Iron Sucrose Injection What is this medication? IRON SUCROSE (EYE ern SOO krose) treats low levels of iron (iron deficiency anemia) in people with kidney disease. Iron is a mineral that plays an important role in making red blood cells, which carry oxygen from your lungs to the rest of your body. This medicine may be used for other purposes; ask your health care provider or pharmacist if you have questions. COMMON BRAND NAME(S): Venofer What should I tell my care team before I take this medication? They need to know if you have any of these conditions: Anemia not caused by low iron levels Heart disease High levels of iron in the blood Kidney disease Liver disease An unusual or allergic reaction to iron, other medications, foods, dyes, or preservatives Pregnant or trying to get pregnant Breastfeeding How should I use this medication? This medication is for infusion into a vein. It is given in a hospital or clinic setting. Talk to your care team about the use of this medication in children. While this medication may be prescribed for children as young as 2 years for selected conditions, precautions do apply. Overdosage: If you think you have taken too much of this medicine contact a poison control center or emergency room at once. NOTE: This medicine is only for you. Do not share this medicine with others. What if I miss a dose? Keep appointments for follow-up doses. It is important not to miss your dose. Call your care team if you are unable to keep an appointment. What may interact with this medication? Do not take this medication with any of the following: Deferoxamine Dimercaprol Other iron products This medication may also interact with the following: Chloramphenicol Deferasirox This list may not describe all possible interactions. Give your health care provider a list of all the medicines, herbs, non-prescription drugs, or dietary supplements you use. Also tell them if you smoke,  drink alcohol, or use illegal drugs. Some items may interact with your medicine. What should I watch for while using this medication? Visit your care team regularly. Tell your care team if your symptoms do not start to get better or if they get worse. You may need blood work done while you are taking this medication. You may need to follow a special diet. Talk to your care team. Foods that contain iron include: whole grains/cereals, dried fruits, beans, or peas, leafy green vegetables, and organ meats (liver, kidney). What side effects may I notice from receiving this medication? Side effects that you should report to your care team as soon as possible: Allergic reactions--skin rash, itching, hives, swelling of the face, lips, tongue, or throat Low blood pressure--dizziness, feeling faint or lightheaded, blurry vision Shortness of breath Side effects that usually do not require medical attention (report to your care team if they continue or are bothersome): Flushing Headache Joint pain Muscle pain Nausea Pain, redness, or irritation at injection site This list may not describe all possible side effects. Call your doctor for medical advice about side effects. You may report side effects to FDA at 1-800-FDA-1088. Where should I keep my medication? This medication is given in a hospital or clinic. It will not be stored at home. NOTE: This sheet is a summary. It may not cover all possible information. If you have questions about this medicine, talk to your doctor, pharmacist, or health care provider.  2024 Elsevier/Gold Standard (2022-12-17 00:00:00)

## 2023-05-19 ENCOUNTER — Inpatient Hospital Stay: Payer: BC Managed Care – PPO

## 2023-05-19 VITALS — BP 109/76 | HR 85 | Temp 98.3°F | Resp 20

## 2023-05-19 DIAGNOSIS — D509 Iron deficiency anemia, unspecified: Secondary | ICD-10-CM | POA: Diagnosis not present

## 2023-05-19 DIAGNOSIS — D500 Iron deficiency anemia secondary to blood loss (chronic): Secondary | ICD-10-CM

## 2023-05-19 MED ORDER — SODIUM CHLORIDE 0.9% FLUSH
10.0000 mL | Freq: Once | INTRAVENOUS | Status: AC | PRN
Start: 2023-05-19 — End: 2023-05-19
  Administered 2023-05-19: 10 mL
  Filled 2023-05-19: qty 10

## 2023-05-19 MED ORDER — IRON SUCROSE 20 MG/ML IV SOLN
200.0000 mg | Freq: Once | INTRAVENOUS | Status: AC
Start: 2023-05-19 — End: 2023-05-19
  Administered 2023-05-19: 200 mg via INTRAVENOUS

## 2023-05-24 ENCOUNTER — Ambulatory Visit: Payer: BC Managed Care – PPO | Admitting: *Deleted

## 2023-05-24 ENCOUNTER — Other Ambulatory Visit: Payer: Self-pay

## 2023-05-24 ENCOUNTER — Inpatient Hospital Stay (HOSPITAL_COMMUNITY)
Admission: AD | Admit: 2023-05-24 | Discharge: 2023-05-27 | DRG: 807 | Disposition: A | Discharge status: Home / Self Care | Payer: BC Managed Care – PPO | Attending: Family Medicine | Admitting: Family Medicine

## 2023-05-24 ENCOUNTER — Encounter: Payer: Self-pay | Admitting: *Deleted

## 2023-05-24 ENCOUNTER — Ambulatory Visit: Payer: BC Managed Care – PPO

## 2023-05-24 ENCOUNTER — Encounter (HOSPITAL_COMMUNITY): Payer: Self-pay | Admitting: Obstetrics and Gynecology

## 2023-05-24 DIAGNOSIS — O26893 Other specified pregnancy related conditions, third trimester: Secondary | ICD-10-CM | POA: Diagnosis present

## 2023-05-24 DIAGNOSIS — Z888 Allergy status to other drugs, medicaments and biological substances status: Secondary | ICD-10-CM | POA: Diagnosis not present

## 2023-05-24 DIAGNOSIS — Z86711 Personal history of pulmonary embolism: Secondary | ICD-10-CM | POA: Diagnosis present

## 2023-05-24 DIAGNOSIS — Z3A38 38 weeks gestation of pregnancy: Secondary | ICD-10-CM | POA: Diagnosis not present

## 2023-05-24 DIAGNOSIS — O24424 Gestational diabetes mellitus in childbirth, insulin controlled: Secondary | ICD-10-CM | POA: Diagnosis present

## 2023-05-24 DIAGNOSIS — Z7901 Long term (current) use of anticoagulants: Secondary | ICD-10-CM | POA: Diagnosis not present

## 2023-05-24 DIAGNOSIS — Z8041 Family history of malignant neoplasm of ovary: Secondary | ICD-10-CM | POA: Diagnosis not present

## 2023-05-24 DIAGNOSIS — O88213 Thromboembolism in pregnancy, third trimester: Secondary | ICD-10-CM | POA: Diagnosis not present

## 2023-05-24 DIAGNOSIS — O99214 Obesity complicating childbirth: Secondary | ICD-10-CM | POA: Diagnosis present

## 2023-05-24 DIAGNOSIS — K219 Gastro-esophageal reflux disease without esophagitis: Secondary | ICD-10-CM | POA: Diagnosis present

## 2023-05-24 DIAGNOSIS — O24414 Gestational diabetes mellitus in pregnancy, insulin controlled: Secondary | ICD-10-CM | POA: Insufficient documentation

## 2023-05-24 DIAGNOSIS — Z87891 Personal history of nicotine dependence: Secondary | ICD-10-CM

## 2023-05-24 DIAGNOSIS — Z7982 Long term (current) use of aspirin: Secondary | ICD-10-CM | POA: Diagnosis not present

## 2023-05-24 DIAGNOSIS — O99213 Obesity complicating pregnancy, third trimester: Secondary | ICD-10-CM | POA: Diagnosis not present

## 2023-05-24 DIAGNOSIS — E669 Obesity, unspecified: Secondary | ICD-10-CM

## 2023-05-24 DIAGNOSIS — Z91048 Other nonmedicinal substance allergy status: Secondary | ICD-10-CM | POA: Diagnosis not present

## 2023-05-24 DIAGNOSIS — Z7951 Long term (current) use of inhaled steroids: Secondary | ICD-10-CM

## 2023-05-24 DIAGNOSIS — Z818 Family history of other mental and behavioral disorders: Secondary | ICD-10-CM

## 2023-05-24 DIAGNOSIS — O9962 Diseases of the digestive system complicating childbirth: Secondary | ICD-10-CM | POA: Diagnosis present

## 2023-05-24 DIAGNOSIS — O281 Abnormal biochemical finding on antenatal screening of mother: Secondary | ICD-10-CM

## 2023-05-24 HISTORY — DX: Gastro-esophageal reflux disease without esophagitis: K21.9

## 2023-05-24 HISTORY — DX: Gestational diabetes mellitus in pregnancy, unspecified control: O24.419

## 2023-05-24 LAB — TYPE AND SCREEN
ABO/RH(D): B POS
Antibody Screen: NEGATIVE

## 2023-05-24 LAB — CBC
HCT: 37.1 % (ref 36.0–46.0)
Hemoglobin: 12.3 g/dL (ref 12.0–15.0)
MCH: 27 pg (ref 26.0–34.0)
MCHC: 33.2 g/dL (ref 30.0–36.0)
MCV: 81.4 fL (ref 80.0–100.0)
Platelets: 227 10*3/uL (ref 150–400)
RBC: 4.56 MIL/uL (ref 3.87–5.11)
RDW: 17.2 % — ABNORMAL HIGH (ref 11.5–15.5)
WBC: 7.2 10*3/uL (ref 4.0–10.5)
nRBC: 0 % (ref 0.0–0.2)

## 2023-05-24 LAB — GLUCOSE, CAPILLARY
Glucose-Capillary: 73 mg/dL (ref 70–99)
Glucose-Capillary: 92 mg/dL (ref 70–99)

## 2023-05-24 MED ORDER — LACTATED RINGERS IV SOLN: INTRAVENOUS | Status: AC

## 2023-05-24 MED ORDER — ACETAMINOPHEN 325 MG PO TABS: 650.0000 mg | ORAL_TABLET | ORAL | Status: DC | PRN

## 2023-05-24 MED ORDER — MISOPROSTOL 50MCG HALF TABLET
50.0000 ug | ORAL_TABLET | ORAL | Status: DC
  Administered 2023-05-24 – 2023-05-25 (×2): 50 ug via BUCCAL
  Filled 2023-05-24 (×2): qty 1

## 2023-05-24 MED ORDER — OXYTOCIN BOLUS FROM INFUSION
333.0000 mL | Freq: Once | INTRAVENOUS | Status: AC
  Administered 2023-05-25: 333 mL via INTRAVENOUS

## 2023-05-24 MED ORDER — SOD CITRATE-CITRIC ACID 500-334 MG/5ML PO SOLN: 30.0000 mL | ORAL | Status: DC | PRN

## 2023-05-24 MED ORDER — FLEET ENEMA RE ENEM: 1.0000 | ENEMA | RECTAL | Status: DC | PRN

## 2023-05-24 MED ORDER — OXYCODONE-ACETAMINOPHEN 5-325 MG PO TABS: 1.0000 | ORAL_TABLET | ORAL | Status: DC | PRN

## 2023-05-24 MED ORDER — TERBUTALINE SULFATE 1 MG/ML IJ SOLN: 0.2500 mg | Freq: Once | INTRAMUSCULAR | Status: DC | PRN

## 2023-05-24 MED ORDER — FENTANYL CITRATE (PF) 100 MCG/2ML IJ SOLN
100.0000 ug | INTRAMUSCULAR | Status: DC | PRN
Start: 2023-05-24 — End: 2023-05-26

## 2023-05-24 MED ORDER — LACTATED RINGERS IV SOLN: 500.0000 mL | INTRAVENOUS | Status: AC | PRN

## 2023-05-24 MED ORDER — ONDANSETRON HCL 4 MG/2ML IJ SOLN
4.0000 mg | Freq: Four times a day (QID) | INTRAMUSCULAR | Status: DC | PRN
  Administered 2023-05-25: 4 mg via INTRAVENOUS
  Filled 2023-05-24: qty 2

## 2023-05-24 MED ORDER — OXYTOCIN-SODIUM CHLORIDE 30-0.9 UT/500ML-% IV SOLN: 2.5000 [IU]/h | INTRAVENOUS | Status: DC

## 2023-05-24 MED ORDER — OXYCODONE-ACETAMINOPHEN 5-325 MG PO TABS: 2.0000 | ORAL_TABLET | ORAL | Status: DC | PRN

## 2023-05-24 MED ORDER — LIDOCAINE HCL (PF) 1 % IJ SOLN
30.0000 mL | INTRAMUSCULAR | Status: AC | PRN
  Administered 2023-05-25: 30 mL via SUBCUTANEOUS
  Filled 2023-05-24: qty 30

## 2023-05-24 NOTE — MAU Note (Signed)
Carmen Rivers is a 27 y.o. at [redacted]w[redacted]d here in MAU reporting: sent from [facility] office for NST. BPP 4/8  Denies any CTXs, LOF or VB. Endorses +FM GDM on 12 units of Lantus at bedtime. Pt states her cbg have been stable at home.   Onset of complaint: today Pain score: 0 Vitals:   05/24/23 1655 05/24/23 1705  BP: 132/75 128/84  Pulse: 87 86  Resp: 18   Temp: (!) 97.5 F (36.4 C)      FHT:148 Lab orders placed from triage:

## 2023-05-24 NOTE — MAU Provider Note (Cosign Needed Addendum)
History     CSN: [id number]  Arrival date and time: 05/24/23 1633   Event Date/Time   First Provider Initiated Contact with Patient 05/24/23 1652      Chief Complaint  Patient presents with   Non-stress Test   Carmen Rivers is a 27y F with PMHx as below presenting to MAU for evaluation of abnormal BPP at MFM office. She was seen today for BPP indicated for GDMA2 surveillance and was found to have BPP 4/8. She was sent to MAU for NST testing and possible IOL indicated for non reassuring fetal state given BPP 4/8. She reports her usual FM, denies LOF, denies VB or contractions.     OB History     Gravida  1   Para      Term      Preterm      AB      Living         SAB      IAB      Ectopic      Multiple      Live Births              Past Medical History:  Diagnosis Date   Anemia    Asthma    Gastritis    Reactive airway disease    Seasonal allergies    Seizures (HCC)     Past Surgical History:  Procedure Laterality Date   NO PAST SURGERIES      Family History  Problem Relation Age of Onset   ADD / ADHD Brother    ADD / ADHD Brother    Cancer Maternal Grandmother        Ovarian cancer    Social History   Tobacco Use   Smoking status: Former    Types: E-cigarettes   Smokeless tobacco: Never  Vaping Use   Vaping status: Former  Substance Use Topics   Alcohol use: Not Currently    Alcohol/week: 1.0 standard drink of alcohol    Types: 1 Standard drinks or equivalent per week    Comment: Last ETOH use 07/19/20   Drug use: Not Currently    Types: Marijuana    Comment: Last marijuana use Christmas 07/19/20.    Allergies:  Allergies  Allergen Reactions   Estrogens    Gramineae Pollens Itching    Medications Prior to Admission  Medication Sig Dispense Refill Last Dose   albuterol (PROVENTIL HFA;VENTOLIN HFA) 108 (90 Base) MCG/ACT inhaler Inhale 2 puffs into the lungs every 6 (six) hours as needed for wheezing or shortness of  breath. 1 Inhaler 0    aspirin 81 MG chewable tablet Chew 81 mg by mouth daily.      BAC 50-325-40 MG tablet Take 1-2 tablets by mouth every 4 (four) hours as needed. (Patient not taking: Reported on 08/30/2022)      enoxaparin (LOVENOX) 100 MG/ML injection Inject 150 mg into the skin daily.      fluticasone-salmeterol (ADVAIR) 250-50 MCG/ACT AEPB Inhale 1 puff into the lungs in the morning and at bedtime.      folic acid (FOLVITE) 800 MCG tablet Take 800 mcg by mouth daily.      insulin glargine (LANTUS) 100 UNIT/ML injection Inject 12 Units into the skin at bedtime.      montelukast (SINGULAIR) 10 MG tablet Take 10 mg by mouth daily.      omeprazole (PRILOSEC) 20 MG capsule Take 20 mg by mouth daily. (Patient not taking: Reported on 02/28/2023)  Prenatal Vit-Fe Fumarate-FA (PRENATAL MULTIVITAMIN) TABS tablet Take 1 tablet by mouth daily at 12 noon. (Patient not taking: Reported on 04/11/2023)        Physical Exam   Last menstrual period 08/26/2022, unknown if currently breastfeeding.  Physical Exam Constitutional:      General: She is not in acute distress.    Appearance: Normal appearance. She is not ill-appearing or toxic-appearing.  HENT:     Head: Normocephalic and atraumatic.     Right Ear: External ear normal.     Left Ear: External ear normal.     Nose: Nose normal.     Mouth/Throat:     Mouth: Mucous membranes are moist.  Eyes:     Conjunctiva/sclera: Conjunctivae normal.  Cardiovascular:     Rate and Rhythm: Normal rate and regular rhythm.     Pulses: Normal pulses.     Heart sounds: Normal heart sounds.  Pulmonary:     Effort: Pulmonary effort is normal.     Breath sounds: Normal breath sounds.  Musculoskeletal:        General: Normal range of motion.     Cervical back: Normal range of motion.  Skin:    Capillary Refill: Capillary refill takes less than 2 seconds.  Neurological:     General: No focal deficit present.     Mental Status: She is alert and oriented  to person, place, and time.     Motor: No weakness.  Psychiatric:        Mood and Affect: Mood normal.        Behavior: Behavior normal.     Fetal Assessment 130 bpm, Mod Var, -Decels, +Accels Toco: uterine irritability   MAU Course  No results found for this or any previous visit (from the past 24 hour(s)). [place] MFM FETAL BPP WO NON STRESS  Result Date: 05/24/2023 ----------------------------------------------------------------------  OBSTETRICS REPORT                       (Signed Final 05/24/2023 04:17 pm) ---------------------------------------------------------------------- Patient Info  ID #:       [id number]                          D.O.B.:  06/15/1996 (27 yrs)  Name:       Carmen Rivers                     Visit Date: 05/24/2023 02:06 pm ---------------------------------------------------------------------- Performed By  Attending:        Braxton Feathers DO       Referred By:      Phineas Real                                                             [facility]  Performed By:     Isac Sarna        [facility]                    BS RDMS  [facility] ---------------------------------------------------------------------- Orders  #  Description                           Code        Ordered By  1  [place] MFM FETAL BPP WO NON               76819.01    [facility]     STRESS ----------------------------------------------------------------------  #  Order #                     Accession #                Episode #  1  [id number]                   [id number]                 [id number] ---------------------------------------------------------------------- Indications  Gestational diabetes in pregnancy, insulin     O24.414  controlled  History of Pulmonary embolism affecting        O88.219  pregnancy  (Lovenox)  Obesity complicating pregnancy, third          O99.213  trimester (pregravid BMI 38)  Abnormal biochemical screen (quad) for         O28.9  Trisomy 21  LR NIPS, Neg Horizon  Encounter for other antenatal screening        Z36.2  follow-up  [redacted] weeks gestation of pregnancy                Z3A.38 ---------------------------------------------------------------------- Fetal Evaluation  Num Of Fetuses:         1  Fetal Heart Rate(bpm):  148  Cardiac Activity:       Observed  Presentation:           Cephalic  Placenta:               Anterior  P. Cord Insertion:      Previously seen  Amniotic Fluid  AFI FV:      Within normal limits  AFI Sum(cm)     %Tile       Largest Pocket(cm)  14.62           58          4.52  RUQ(cm)       RLQ(cm)       LUQ(cm)        LLQ(cm)  4.52          3.01          3.94           3.15 ---------------------------------------------------------------------- Biophysical Evaluation  Amniotic F.V:   Pocket => 2 cm  F. Tone:        Not Observed  F. Movement:    Not Observed               Score:          4/8  F. Breathing:   Observed ---------------------------------------------------------------------- OB History  Gravidity:    1 ---------------------------------------------------------------------- Gestational Age  LMP:           38w 5d        Date:  08/26/22                   EDD:   06/02/23  Best:          38w 5d     Det. By:  LMP  (08/26/22)          EDD:   06/02/23 ---------------------------------------------------------------------- Anatomy  Diaphragm:             Appears normal         Kidneys:                Appear normal  Stomach:               Appears normal, left   Bladder:                Appears normal                         sided ---------------------------------------------------------------------- Comments  The patient is here for a BPP for GMDA2 on insulin. She is at  38w 5d. EDD of 06/02/2023 dated by: LMP  (08/26/22).  She  reports normal fetal movement and normal  blood sugars.  Sonographic findings  Single intrauterine pregnancy.  Fetal cardiac activity: Observed.  Presentation: Cephalic.  Interval fetal anatomy appears normal.  Amniotic fluid volume: Within normal limits. AFI: 14.62 cm.  MVP: 4.52 cm.  Placenta: Anterior.  BPP: 4/8 (tone and movement).  Recommendations  - Due to the increased risk of stillbirth with a BPP of 4/8 I  recommended the go to the nearest hospital for monitoring  and delivery. Due to the time of day and NST was not able to  be performed and it would also not change the  recommendation for delivery. Since she is > 37 weeks, even  if the BPP was 6/10, delivery is recommended. The patient  expressed frustration and was not expecting this news today  but verbalized understanding. If the NST is reactive she could  drive to [facility] if she would prefer to deliver at that facility. ----------------------------------------------------------------------                   Braxton Feathers, DO Electronically Signed Final Report   05/24/2023 04:17 pm ----------------------------------------------------------------------    MDM - High    Assessment and Plan  27y  G1P0 female with PMHx as above presenting to MAU for low score on BPP.   SIUP at [redacted]w[redacted]d GA here for NST to follow up on BPP.  Cat I FT - reassuring, however still with BPP 6/10 after considering NST.  CNM Cella Cappello discussed NST findings with Dr. Parke Poisson MFM who was reassured by NST and deemed reasonable to reach out to established providers to induce labor within the next day or two. After r/b discussion with patient of continuing pregnancy vs admission for IOL today, patient opts for admission and IOL.   Patient is on chronic anticoagulation for unprovoked DVT/PE.    Milas Hock  Rossy, MD 05/24/2023, 4:53 PM    I performed the exam and agree with above. Discussed last Lovenox 0730 this morning with Dr. Para March and Anesthesiologist. OK to start IOL but need to inform pt that she would not be able to  get epidural/spinal until 0730 tomorrow.   Katrinka Blazing, [place], [place] 05/24/2023 10:24 PM

## 2023-05-24 NOTE — H&P (Cosign Needed Addendum)
OBSTETRIC ADMISSION HISTORY AND PHYSICAL  Carmen Rivers is a 27 y.o. female G1P0 with IUP at [redacted]w[redacted]d (dated by LMP, Estimated Date of Delivery: 06/02/23) presenting for IOL sent from MFM for BPP 6/10. A2 GDM on insulin and Hx PE in lovenox.   She reports +FMs, No LOF, no VB, no blurry vision, headaches or peripheral edema, and RUQ pain.    She plans on breat feeding. She request condom use for birth control.  She received her prenatal care at  [facility]    Prenatal History/Complications: None  Past Medical History: Past Medical History:  Diagnosis Date   Anemia    Asthma    Gastritis    GERD (gastroesophageal reflux disease)    Gestational diabetes    Reactive airway disease    Seasonal allergies    Seizures (HCC)     Past Surgical History: Past Surgical History:  Procedure Laterality Date   NO PAST SURGERIES      Obstetrical History: OB History     Gravida  1   Para      Term      Preterm      AB      Living         SAB      IAB      Ectopic      Multiple      Live Births              Social History Social History   Socioeconomic History   Marital status: Single    Spouse name: Carmen Rivers   Number of children: 0   Years of education: Not on file   Highest education level: Bachelor's degree (e.g., BA, AB, BS)  Occupational History   Occupation: Runner, broadcasting/film/video    Comment: Freight forwarder  Tobacco Use   Smoking status: Former    Types: E-cigarettes   Smokeless tobacco: Never  Vaping Use   Vaping status: Former  Substance and Sexual Activity   Alcohol use: Not Currently    Alcohol/week: 1.0 standard drink of alcohol    Types: 1 Standard drinks or equivalent per week    Comment: Last ETOH use 07/19/20   Drug use: Not Currently    Types: Marijuana    Comment: Last marijuana use Christmas 07/19/20.   Sexual activity: Yes    Partners: Male    Birth control/protection: Condom  Other Topics Concern   Not on  file  Social History Narrative   Not on file   Social Determinants of Health   Financial Resource Strain: Not on file  Food Insecurity: Not on file  Transportation Needs: Not on file  Physical Activity: Not on file  Stress: Not on file  Social Connections: Not on file    Family History: Family History  Problem Relation Age of Onset   ADD / ADHD Brother    ADD / ADHD Brother    Cancer Maternal Grandmother        Ovarian cancer    Allergies: Allergies  Allergen Reactions   Estrogens    Gramineae Pollens Itching    Medications Prior to Admission  Medication Sig Dispense Refill Last Dose   albuterol (PROVENTIL HFA;VENTOLIN HFA) 108 (90 Base) MCG/ACT inhaler Inhale 2 puffs into the lungs every 6 (six) hours as needed for wheezing or shortness of breath. 1 Inhaler 0 Past Month   aspirin 81 MG chewable tablet Chew 81 mg by mouth daily.   05/24/2023  enoxaparin (LOVENOX) 100 MG/ML injection Inject 150 mg into the skin daily.   05/24/2023   fluticasone-salmeterol (ADVAIR) 250-50 MCG/ACT AEPB Inhale 1 puff into the lungs in the morning and at bedtime.   Past Week   folic acid (FOLVITE) 800 MCG tablet Take 800 mcg by mouth daily.   05/24/2023   insulin glargine (LANTUS) 100 UNIT/ML injection Inject 12 Units into the skin at bedtime.   05/24/2023   BAC 50-325-40 MG tablet Take 1-2 tablets by mouth every 4 (four) hours as needed. (Patient not taking: Reported on 08/30/2022)      montelukast (SINGULAIR) 10 MG tablet Take 10 mg by mouth daily.   Unknown   omeprazole (PRILOSEC) 20 MG capsule Take 20 mg by mouth daily. (Patient not taking: Reported on 02/28/2023)      Prenatal Vit-Fe Fumarate-FA (PRENATAL MULTIVITAMIN) TABS tablet Take 1 tablet by mouth daily at 12 noon. (Patient not taking: Reported on 04/11/2023)        Review of Systems  All systems reviewed and negative except as stated in HPI.  Blood pressure 128/72, pulse 75, temperature 98 F (36.7 C), temperature source Oral,  resp. rate 18, height 5\' 4"  (1.626 m), weight 120.2 kg, last menstrual period 08/26/2022, unknown if currently breastfeeding. General appearance: alert, cooperative, and no distress Lungs: breathing comfortably on room air Heart: regular rate Abdomen: soft, non-tender; gravid Extremities: no edema of bilateral lower extremities Presentation: cephalic confirmed on [place] today Fetal monitoringBaseline: 145 bpm and Variability: Good {> 6 bpm) Uterine activity: irritability  Dilation: Closed Effacement (%): Thick Station: Ballotable Exam by:: Carmen Rivers CNM   Prenatal labs: ABO, Rh:  B+ Antibody:  Negative Rubella:  immune  RPR:   Non reactive HBsAg:   Negative HIV:   Non reactive GBS:   negative 2 hr Glucola Failed Genetic screening: Quad screen flags high risk for T21, but  Panorama low risk Anatomy US: Normal anatomy- limited views, 3 vessel cord, placenta anterior Last Korea: At 36w - cephalic presentation, EFW 3346g (82 %tile), AC 78  Prenatal Transfer Tool  Maternal Diabetes: Yes:  Diabetes Type:  Insulin/Medication controlled Genetic Screening: Abnormal:  Results: Elevated risk of Trisomy 21 But panorama low risk Maternal Ultrasounds/Referrals: Normal Fetal Ultrasounds or other Referrals:  None Maternal Substance Abuse:  No Significant Maternal Medications:  Meds include: Other: Lantus and Lovenox Significant Maternal Lab Results:  Group B Strep negative Number of Prenatal Visits:greater than 3 verified prenatal visits  No results found for this or any previous visit (from the past 24 hour(s)).  Patient Active Problem List   Diagnosis Date Noted   Insulin controlled gestational diabetes mellitus (GDM) in third trimester 05/24/2023   Gestational diabetes requiring insulin 04/12/2023   IDA (iron deficiency anemia) 04/26/2022   Morbid obesity (HCC) 220 lbs 08/07/2020   Eczema 08/07/2020   Asthma 08/07/2020   Anemia 09/30/2015   Menorrhagia 09/30/2015   Pulmonary embolism on  right (HCC) 09/21/2015   Lower urinary tract infectious disease 09/21/2015   History of pulmonary embolism 09/21/2015    Assessment/Plan:  Carmen Rivers is a 27 y.o. G1P0 at [redacted]w[redacted]d here for IOL for A2 GM on lantus 12u daily. Sent from MFM for BPP 6/10, confirmed vertex position at that visit.   #Labor:Cytotec #Pain: Desires to go without epidural, Epidural is contraindicated until 24 hours after her last dose of lovenox (10/29 0730) #FWB: Cat 1 #ID:  GBS negative  #MOF: Breast  #MOC:Condoms   Mayra Reel DO PGY 2 05/24/23  7:18 PM    GME ATTESTATION:  Evaluation and management procedures were performed by the [facility] Medicine Resident under my supervision. I was immediately available for direct supervision, assistance and direction throughout this encounter.  I also confirm that I have verified the information documented in the resident's note, and that I have also personally reperformed the pertinent components of the physical exam and all of the medical decision making activities.  I have also made any necessary editorial changes.  Wyn Forster, MD OB Fellow, Faculty Practice [facility], Center for [facility] 05/25/2023 5:35 AM

## 2023-05-25 ENCOUNTER — Inpatient Hospital Stay (HOSPITAL_COMMUNITY): Payer: BC Managed Care – PPO | Admitting: Anesthesiology

## 2023-05-25 DIAGNOSIS — O99214 Obesity complicating childbirth: Secondary | ICD-10-CM

## 2023-05-25 DIAGNOSIS — O24424 Gestational diabetes mellitus in childbirth, insulin controlled: Secondary | ICD-10-CM

## 2023-05-25 DIAGNOSIS — Z3A38 38 weeks gestation of pregnancy: Secondary | ICD-10-CM

## 2023-05-25 LAB — GLUCOSE, CAPILLARY
Glucose-Capillary: 81 mg/dL (ref 70–99)
Glucose-Capillary: 77 mg/dL (ref 70–99)
Glucose-Capillary: 127 mg/dL — ABNORMAL HIGH (ref 70–99)
Glucose-Capillary: 107 mg/dL — ABNORMAL HIGH (ref 70–99)

## 2023-05-25 LAB — RPR: RPR Ser Ql: NONREACTIVE

## 2023-05-25 MED ORDER — TRANEXAMIC ACID-NACL 1000-0.7 MG/100ML-% IV SOLN: 1000.0000 mg | INTRAVENOUS | Status: AC

## 2023-05-25 MED ORDER — DIPHENHYDRAMINE HCL 50 MG/ML IJ SOLN: 12.5000 mg | INTRAMUSCULAR | Status: DC | PRN

## 2023-05-25 MED ORDER — FENTANYL-BUPIVACAINE-NACL 0.5-0.125-0.9 MG/250ML-% EP SOLN
12.0000 mL/h | EPIDURAL | Status: DC | PRN
  Administered 2023-05-25: 12 mL/h via EPIDURAL

## 2023-05-25 MED ORDER — TRANEXAMIC ACID-NACL 1000-0.7 MG/100ML-% IV SOLN
INTRAVENOUS | Status: AC
  Administered 2023-05-25: 1000 mg via INTRAVENOUS
  Filled 2023-05-25: qty 100

## 2023-05-25 MED ORDER — PHENYLEPHRINE 80 MCG/ML (10ML) SYRINGE FOR IV PUSH (FOR BLOOD PRESSURE SUPPORT): 80.0000 ug | PREFILLED_SYRINGE | INTRAVENOUS | Status: DC | PRN

## 2023-05-25 MED ORDER — EPHEDRINE 5 MG/ML INJ: 10.0000 mg | INTRAVENOUS | Status: DC | PRN

## 2023-05-25 MED ORDER — TERBUTALINE SULFATE 1 MG/ML IJ SOLN: 0.2500 mg | Freq: Once | INTRAMUSCULAR | Status: DC | PRN

## 2023-05-25 MED ORDER — LACTATED RINGERS IV SOLN: 500.0000 mL | Freq: Once | INTRAVENOUS | Status: DC

## 2023-05-25 MED ORDER — FENTANYL-BUPIVACAINE-NACL 0.5-0.125-0.9 MG/250ML-% EP SOLN
EPIDURAL | Status: AC
  Filled 2023-05-25: qty 250

## 2023-05-25 MED ORDER — LIDOCAINE-EPINEPHRINE (PF) 2 %-1:200000 IJ SOLN
INTRAMUSCULAR | Status: DC | PRN
  Administered 2023-05-25: 5 mL via EPIDURAL

## 2023-05-25 MED ORDER — OXYTOCIN-SODIUM CHLORIDE 30-0.9 UT/500ML-% IV SOLN
1.0000 m[IU]/min | INTRAVENOUS | Status: DC
Start: 2023-05-25 — End: 2023-05-26
  Administered 2023-05-25: 6 m[IU]/min via INTRAVENOUS
  Administered 2023-05-25: 2 m[IU]/min via INTRAVENOUS
  Administered 2023-05-25: 4 m[IU]/min via INTRAVENOUS
  Filled 2023-05-25: qty 500

## 2023-05-25 NOTE — Anesthesia Preprocedure Evaluation (Addendum)
Anesthesia Evaluation  Patient identified by MRN, date of birth, ID band Patient awake    Reviewed: Allergy & Precautions, NPO status , Patient's Chart, lab work & pertinent test results  Airway Mallampati: III  TM Distance: >3 FB Neck ROM: Full    Dental no notable dental hx.    Pulmonary asthma , former smoker, PE (lovenox LD 10/29 0700)   Pulmonary exam normal breath sounds clear to auscultation       Cardiovascular negative cardio ROS Normal cardiovascular exam Rhythm:Regular Rate:Normal     Neuro/Psych Seizures -, Well Controlled,   negative psych ROS   GI/Hepatic Neg liver ROS,GERD  ,,  Endo/Other  diabetes, Gestational, Insulin Dependent  Morbid obesity (BMI 46)  Renal/GU negative Renal ROS  negative genitourinary   Musculoskeletal negative musculoskeletal ROS (+)    Abdominal   Peds  Hematology   Anesthesia Other Findings IOL for gDM  Reproductive/Obstetrics                             Anesthesia Physical Anesthesia Plan  ASA: 3  Anesthesia Plan: Epidural   Post-op Pain Management:    Induction:   PONV Risk Score and Plan: Treatment may vary due to age or medical condition  Airway Management Planned: Natural Airway  Additional Equipment:   Intra-op Plan:   Post-operative Plan:   Informed Consent: I have reviewed the patients History and Physical, chart, labs and discussed the procedure including the risks, benefits and alternatives for the proposed anesthesia with the patient or authorized representative who has indicated his/her understanding and acceptance.       Plan Discussed with: Anesthesiologist  Anesthesia Plan Comments: (Patient identified. Risks, benefits, options discussed with patient including but not limited to bleeding, infection, nerve damage, paralysis, failed block, incomplete pain control, headache, blood pressure changes, nausea, vomiting,  reactions to medication, itching, and post partum back pain. Confirmed with bedside nurse the patient's most recent platelet count. Confirmed with the patient that they are not taking any anticoagulation, have any bleeding history or any family history of bleeding disorders. Patient expressed understanding and wishes to proceed. All questions were answered. )       Anesthesia Quick Evaluation

## 2023-05-25 NOTE — Progress Notes (Signed)
Labor Progress Note Carmen Rivers is a 27 y.o. G1P0 at [redacted]w[redacted]d presented for IOL for BPP 6/10, A2GDM S: Breathing through contractions, up on the ball. Requesting IUPC be removed due to causing "pain in her vagina". Discussed risks, benefits, and indications for IUPC for optimal pitocin titration and fetal monitoring. Patient expressed understanding, but continued desire for removal.   O:  BP (!) 118/37   Pulse (!) 105   Temp 98 F (36.7 C) (Oral)   Resp 18   Ht 5\' 4"  (1.626 m)   Wt 120.2 kg   LMP 08/26/2022   SpO2 97%   BMI 45.50 kg/m  EFM: 150/moderate variability/accels present/no decels  CVE: Dilation: 4 Effacement (%): 50 Cervical Position: Middle Station: -2 Presentation: Vertex Exam by:: Caudle, MD   A&P: 28 y.o. G1P0 [redacted]w[redacted]d admitted for IOL #Labor: Progressing well. IUPC removed. Pitocin increased to 6u #Pain: Nitrous gas #FWB: Cat I #GBS negative  #A2 GDM on insulin at home; q4h monitoring in latent labor, q2h in active; Last CBG 107   #Hx PE in lovenox; last dose 0730 10/29. Will need PP therapeutic lovenox 120mg /daily starting 12-24hrs after delivery.  Wyn Forster, MD 8:33 PM

## 2023-05-25 NOTE — Progress Notes (Signed)
Labor Progress Note Carmen Rivers is a 27 y.o. G1P0 at [redacted]w[redacted]d presented for IOL S: Requesting SVE.   O:  BP (!) 138/59   Pulse (!) 111   Temp 98 F (36.7 C) (Oral)   Resp 18   Ht 5\' 4"  (1.626 m)   Wt 120.2 kg   LMP 08/26/2022   SpO2 97%   BMI 45.50 kg/m  EFM: 150/moderate variability/accels present/no decels  CVE: Dilation: 6 Effacement (%): 80, 90 Cervical Position: Middle Station: -3 Presentation: Vertex Exam by:: Dr. Leanora Cover   A&P: 27 y.o. G1P0 [redacted]w[redacted]d admitted for IOL #Labor: Progressing well.  #Pain: Nitrous gas #FWB: Cat I #GBS negative  #A2 GDM on insulin at home; q4h monitoring in latent labor, q2h in active; Last CBG 107   #Hx PE in lovenox; last dose 0730 10/29. Will need PP therapeutic lovenox 120mg /daily starting 12-24hrs after delivery.  Wyn Forster, MD 9:38 PM

## 2023-05-25 NOTE — Anesthesia Procedure Notes (Signed)
Epidural Patient location during procedure: OB Start time: 05/25/2023 10:15 AM End time: 05/25/2023 10:25 AM  Staffing Anesthesiologist: Elmer Picker, MD Performed: anesthesiologist   Preanesthetic Checklist Completed: patient identified, IV checked, risks and benefits discussed, monitors and equipment checked, pre-op evaluation and timeout performed  Epidural Patient position: sitting Prep: DuraPrep and site prepped and draped Patient monitoring: continuous pulse ox, blood pressure, heart rate and cardiac monitor Approach: midline Location: L3-L4 Injection technique: LOR air  Needle:  Needle type: Tuohy  Needle gauge: 17 G Needle length: 9 cm Needle insertion depth: 7 cm Catheter type: closed end flexible Catheter size: 19 Gauge Catheter at skin depth: 12 cm Test dose: negative  Assessment Sensory level: T8 Events: blood not aspirated, no cerebrospinal fluid, injection not painful, no injection resistance, no paresthesia and negative IV test  Additional Notes Patient identified. Risks/Benefits/Options discussed with patient including but not limited to bleeding, infection, nerve damage, paralysis, failed block, incomplete pain control, headache, blood pressure changes, nausea, vomiting, reactions to medication both or allergic, itching and postpartum back pain. Confirmed with bedside nurse the patient's most recent platelet count. Confirmed with patient that they are not currently taking any anticoagulation, have any bleeding history or any family history of bleeding disorders. Patient expressed understanding and wished to proceed. All questions were answered. Sterile technique was used throughout the entire procedure. Please see nursing notes for vital signs. Test dose was given through epidural catheter and negative prior to continuing to dose epidural or start infusion. Warning signs of high block given to the patient including shortness of breath, tingling/numbness in  hands, complete motor block, or any concerning symptoms with instructions to call for help. Patient was given instructions on fall risk and not to get out of bed. All questions and concerns addressed with instructions to call with any issues or inadequate analgesia.  Reason for block:procedure for pain

## 2023-05-25 NOTE — Progress Notes (Signed)
Labor Progress Note Siniya Searson is a 27 y.o. G1P0 at [redacted]w[redacted]d presented for IOL for BPP 6/8 S: Mild cramping only, otherwise comfortable. Discussed role of FB given contracting too frequently for additional Cytotec; agreeable.   O:  BP 117/62   Pulse 85   Temp 98 F (36.7 C) (Oral)   Resp 16   Ht 5\' 4"  (1.626 m)   Wt 120.2 kg   LMP 08/26/2022   BMI 45.50 kg/m  EFM: 145/moderate variability/accels present/no decels  CVE: Dilation: 1 Effacement (%): 50 Cervical Position: Middle Station: -3 Presentation: Vertex Exam by:: Dr. Leanora Cover   A&P: 27 y.o. G1P0 [redacted]w[redacted]d admitted for IOL #Labor: Progressing well. Cooks catheter placed with 60/67ml #Pain: Coping well, considering IV Fentanyl following Cooks.  #FWB: Cat I #GBS negative  #A2 GDM on insulin at home; q4h monitoring in latent labor, q2h in active; Last CBG 92->81   #Hx PE in lovenox; last dose 0730 10/29. Will need PP therapeutic lovenox 120mg /daily starting 12-24hrs after delivery.  Wyn Forster, MD 6:06 AM

## 2023-05-25 NOTE — Progress Notes (Signed)
Patient ID: Janetzy Barut, female   DOB: 1996-07-26, 26 y.o.   MRN: [id number] LABOR PROGRESS NOTE  Patient Name: Christinemarie Rouch, female   DOB: 03/05/1996, 27 y.o.  MRN: [id number]  G1P0 at [redacted]w[redacted]d admitted for IOL for BPP 6/8  S: Patient feeling well, still up moving around the room. Pain is well tolerated.   O:  BP (!) 117/49   Pulse 78   Temp 98.5 F (36.9 C) (Oral)   Resp 18   Ht 5\' 4"  (1.626 m)   Wt 120.2 kg   LMP 08/26/2022   BMI 45.50 kg/m  EFM:145bpm/Moderate variability/ 15x15 accels/ None decels CAT: 1 Toco: regular, every 3-5 minutes   CVE: Dilation: 4 Effacement (%): 50 Cervical Position: Middle Station: -2 Presentation: Vertex Exam by:: Dr. Haywood Lasso   A&P:   #Labor: Progressing well. Has gotten 2 doses of buccal cytotec. Cook catheter out at 1250. Counseled on risks/benefits of AROM and patient consents. AROM at 1300 with clear fluid. Patient elects to defer pitocin augmentation at this time, will reassess her progression in ~2 hours.  #Pain: Family/Friend support and PO/IV pain meds #FWB: CAT 1 #GBS negative #Anticipate vaginal delivery  #A2 GDM on insulin at home; q4h monitoring in latent labor, q2h in active; Last CBG 92->81   #Hx PE in lovenox; last dose 0730 10/29. Will need PP therapeutic lovenox 120mg /daily starting 12-24hrs after delivery.  Bernette Mayers S, DO 05/25/23  1:10 PM

## 2023-05-25 NOTE — Discharge Summary (Signed)
Postpartum Discharge Summary      Patient Name: Carmen Rivers DOB: 11/28/1995 MRN: [id number]  Date of admission: 05/24/2023 Delivery date:05/25/2023 Delivering provider: Wyn Forster Date of discharge: 05/27/2023  Admitting diagnosis: Insulin controlled gestational diabetes mellitus (GDM) in third trimester [O24.414] Intrauterine pregnancy: [redacted]w[redacted]d     Secondary diagnosis:  Principal Problem:   Insulin controlled gestational diabetes mellitus (GDM) in third trimester Active Problems:   History of pulmonary embolism   Morbid obesity (HCC) 220 lbs   NSVD (normal spontaneous vaginal delivery)  Additional problems: Hx of PE on therapeutic lovenox    Discharge diagnosis: Term Pregnancy Delivered and GDM A2                                              Post partum procedures: none Augmentation: AROM, Pitocin, Cytotec, and IP Foley Complications: None  Hospital course: Induction of Labor With Vaginal Delivery   27 y.o. yo G1P1001 at [redacted]w[redacted]d was admitted to the hospital 05/24/2023 for induction of labor.  Indication for induction: A2 DM.  Patient had an labor course uncomplicated. Membrane Rupture Time/Date: 1:06 PM,05/25/2023  Delivery Method:Vaginal, Spontaneous Operative Delivery:N/A Episiotomy: None Lacerations:  2nd degree;Perineal Details of delivery can be found in separate delivery note.  Patient had a postpartum course complicated by nothing. Will resume treatment-dose lovenox per heme recs for 6 wks postpartum. Patient is discharged home 05/27/23.  Newborn Data: Birth date:05/25/2023 Birth time:10:46 PM Gender:Female Living status:Living Apgars:8 ,9  Weight:3402 g  Magnesium Sulfate received: No BMZ received: No Rhophylac:No MMR:Yes Transfusion:No  Immunizations received: Immunization History  Administered Date(s) Administered   Influenza,inj,Quad PF,6+ Mos 09/24/2015    Physical exam  Vitals:   05/26/23 1300 05/26/23 2146 05/27/23 0528 05/27/23 0950  BP:  123/63 112/68 117/68   Pulse: 89 87 76   Resp: 18 16 14    Temp: 98.2 F (36.8 C) 98 F (36.7 C) 98.6 F (37 C)   TempSrc: Oral Oral Oral   SpO2: 99% 95% 96%   Weight:    116.4 kg  Height:       General: alert, cooperative, and no distress Lochia: appropriate Uterine Fundus: firm Incision: N/A DVT Evaluation: No evidence of DVT seen on physical exam. Labs: Lab Results  Component Value Date   WBC 18.1 (H) 05/26/2023   HGB 11.7 (L) 05/26/2023   HCT 35.3 (L) 05/26/2023   MCV 81.1 05/26/2023   PLT 226 05/26/2023      Latest Ref Rng & Units 04/26/2022    3:44 PM  CMP  Glucose 70 - 99 mg/dL 272   BUN 6 - 20 mg/dL 6   Creatinine 5.36 - 6.44 mg/dL 0.34   Sodium 742 - 595 mmol/L 135   Potassium 3.5 - 5.1 mmol/L 3.6   Chloride 98 - 111 mmol/L 105   CO2 22 - 32 mmol/L 26   Calcium 8.9 - 10.3 mg/dL 9.0   Total Protein 6.5 - 8.1 g/dL 8.5   Total Bilirubin 0.3 - 1.2 mg/dL 0.4   Alkaline Phos 38 - 126 U/L 75   AST 15 - 41 U/L 16   ALT 0 - 44 U/L 11    Edinburgh Score:    05/26/2023    8:57 AM  Edinburgh Postnatal Depression Scale Screening Tool  I have been able to laugh and see the funny side of things. 0  I have looked forward with enjoyment to things. 0  I have blamed myself unnecessarily when things went wrong. 0  I have been anxious or worried for no good reason. 0  I have felt scared or panicky for no good reason. 0  Things have been getting on top of me. 1  I have been so unhappy that I have had difficulty sleeping. 0  I have felt sad or miserable. 1  I have been so unhappy that I have been crying. 1  The thought of harming myself has occurred to me. 0  Edinburgh Postnatal Depression Scale Total 3   Edinburgh Postnatal Depression Scale Total: 3   After visit meds:  Allergies as of 05/27/2023       Reactions   Estrogens    Gramineae Pollens Itching        Medication List     STOP taking these medications    aspirin 81 MG chewable tablet   Bac  50-325-40 MG tablet Generic drug: butalbital-acetaminophen-caffeine   folic acid 800 MCG tablet Commonly known as: FOLVITE   insulin glargine 100 UNIT/ML injection Commonly known as: LANTUS   omeprazole 20 MG capsule Commonly known as: PRILOSEC       TAKE these medications    acetaminophen 325 MG tablet Commonly known as: Tylenol Take 2 tablets (650 mg total) by mouth every 4 (four) hours as needed (for pain scale < 4).   albuterol 108 (90 Base) MCG/ACT inhaler Commonly known as: VENTOLIN HFA Inhale 2 puffs into the lungs every 6 (six) hours as needed for wheezing or shortness of breath.   enoxaparin 100 MG/ML injection Commonly known as: LOVENOX Inject 1.75 mLs (175 mg total) into the skin daily. What changed: how much to take   fluticasone-salmeterol 250-50 MCG/ACT Aepb Commonly known as: ADVAIR Inhale 1 puff into the lungs in the morning and at bedtime.   ibuprofen 600 MG tablet Commonly known as: ADVIL Take 1 tablet (600 mg total) by mouth every 6 (six) hours.   montelukast 10 MG tablet Commonly known as: SINGULAIR Take 10 mg by mouth daily.   prenatal multivitamin Tabs tablet Take 1 tablet by mouth daily at 12 noon.         Discharge home in stable condition Infant Feeding: Breast Infant Disposition:home with mother Discharge instruction: per After Visit Summary and Postpartum booklet. Activity: Advance as tolerated. Pelvic rest for 6 weeks.  Diet: routine diet Future Appointments: Future Appointments  Date Time Provider Department Center  09/05/2023  3:30 PM [facility] None  09/08/2023  2:30 PM Rickard Patience, MD [facility] None  09/08/2023  2:45 PM [facility] INFUSION CHAIR 2 [facility] None   Follow up Visit:  Follow-up Information     [facility], [facility] Follow up in 6 week(s).   Specialty: General Practice Contact information: [address] [place] [address] [phone]                 Please  schedule this patient for a In person postpartum visit in 6 weeks with the following provider: Any provider. Additional Postpartum F/U:Postpartum Depression checkup and 2 hour GTT  Low risk pregnancy complicated by: GDM and Hx of PE on therapeutic lovenox Delivery mode:  Vaginal, Spontaneous Anticipated Birth Control:  Condoms   05/27/2023 Silvano Bilis, MD

## 2023-05-25 NOTE — Progress Notes (Signed)
Patient ID: Carmen Rivers, female   DOB: [birth year], 27 y.o.   MRN: [id number] LABOR PROGRESS NOTE  Patient Name: Carmen Rivers, female   DOB: 1995/12/04, 27 y.o.  MRN: [id number]  G1P0 at [redacted]w[redacted]d admitted for IOL for BPP 6/8  S: She is still mobile around her room back and forth to the bathroom. She is feeling her pain more but does not desire pain control at this time.   O:  BP (!) 117/49   Pulse 78   Temp (!) 97.5 F (36.4 C) (Oral)   Resp 18   Ht 5\' 4"  (1.626 m)   Wt 120.2 kg   LMP 08/26/2022   BMI 45.50 kg/m  EFM:150bpm/Moderate variability/ 15x15 accels/ Variable decels CAT: 1 Toco: not picking up well due to device not staying in place   CVE: Dilation: 4 Effacement (%): 50 Cervical Position: Middle Station: -2 Presentation: Vertex Exam by:: Skylee Baird, MD   A&P:   #Labor: Progression has stalled following AROM at 1300. Difficult to quantify contractions on the toco. Discussed with patient for placement of IUPC device to better detect her contraction pattern and patient consents, IUPC placed at 1610. Discussed with patient that her cervix is unchanged over the last 3 hours. Offered augmentation of labor with pitocin, patient will not engage in conversation and will not give verbal response.  #Pain: Family/Friend support PRN fentanyl available upon request  #FWB: CAT 1 #GBS negative #Anticipate vaginal delivery  #A2 GDM on insulin at home; q4h monitoring in latent labor, q2h in active; Last CBG 92->81   #Hx PE in lovenox; last dose 0730 10/29. Will need PP therapeutic lovenox 120mg /daily starting 12-24hrs after delivery.   Bryna Colander, DO 05/25/23  4:16 PM

## 2023-05-25 NOTE — Progress Notes (Signed)
Primary RN notified me that patient was using nitrous oxide while on the birthing ball. I went into the room and educated the patient about the risks of using nitrous while on the birthing ball. Patient got back in the bed and continued the use of nitrous.

## 2023-05-25 NOTE — Progress Notes (Signed)
Patient requesting the IUPC be removed. States it is causing her pain. Dr. Leanora Cover at bedside. Educated patient on the benefits of keeping the IUPC in place. Patient still requests that it be removed. IUPC removed.

## 2023-05-25 NOTE — Progress Notes (Signed)
Labor Progress Note Carmen Rivers is a 27 y.o. G1P0 at [redacted]w[redacted]d presented for IOL of BPP 6 S: Resting comfortably.   O:  BP 117/62   Pulse 85   Temp 98 F (36.7 C) (Oral)   Resp 16   Ht 5\' 4"  (1.626 m)   Wt 120.2 kg   LMP 08/26/2022   BMI 45.50 kg/m  EFM: 125/moderate variability/accels present/no decels  CVE: Dilation: Fingertip Effacement (%): 50 Cervical Position: Middle Station: -3 Presentation: Vertex Exam by:: Dr. Leanora Cover   A&P: 27 y.o. G1P0 [redacted]w[redacted]d admitted for IOL  #Labor: Progressing well.  #Pain: None #FWB: Cat I #GBS negative  #A2 GDM on insulin at home; q4h monitoring in latent labor, q2h in active; Last CBG 92  #Hx PE in lovenox; last dose 0730 10/29. Will need PP therapeutic lovenox 120mg /daily starting 12-24hrs after delivery.  Wyn Forster, MD 5:31 AM

## 2023-05-25 NOTE — Progress Notes (Signed)
Prenatal labs: (11/15/22) ABO, Rh:  B+  Antibody:  Negative Rubella:  immune  RPR:   Non reactive HBsAg:   Negative HIV:   Non reactive   Mayra Reel DO PGY2

## 2023-05-25 NOTE — Progress Notes (Signed)
Patient ID: Carmen Rivers, female   DOB: [birth year], 27 y.o.   MRN: [id number] LABOR PROGRESS NOTE  Patient Name: Carmen Rivers, female   DOB: 28-May-1996, 27 y.o.  MRN: [id number]  G1P0 at [redacted]w[redacted]d admitted for IOL for BPP 6/8.   S: She is starting to feel her contractions, becoming more uncomfortable.   O:  BP 127/72   Pulse (!) 101   Temp 98.5 F (36.9 C) (Oral)   Resp 18   Ht 5\' 4"  (1.626 m)   Wt 120.2 kg   LMP 08/26/2022   BMI 45.50 kg/m  EFM:145bpm/Moderate variability/ 15x15 accels/ None decels CAT: 1 Toco: regular   CVE: Dilation: 1 Effacement (%): 50 Cervical Position: Middle Station: -3 Presentation: Vertex Exam by:: Dr. Leanora Cover   A&P:   #Labor: Progressing well. Has gotten 2 doses of buccal cytotec. [name] still in place. Membranes intact. #Pain: Family/Friend support and PO/IV pain meds #FWB: CAT 1 #GBS negative #Anticipate vaginal delivery   #A2 GDM on insulin at home; q4h monitoring in latent labor, q2h in active; Last CBG 92->81   #Hx PE in lovenox; last dose 0730 10/29. Will need PP therapeutic lovenox 120mg /daily starting 12-24hrs after delivery.  Bryna Colander, DO 05/25/23  12:22 PM

## 2023-05-26 ENCOUNTER — Inpatient Hospital Stay: Payer: BC Managed Care – PPO

## 2023-05-26 ENCOUNTER — Encounter (HOSPITAL_COMMUNITY): Payer: Self-pay | Admitting: Obstetrics and Gynecology

## 2023-05-26 LAB — CBC
HCT: 35.3 % — ABNORMAL LOW (ref 36.0–46.0)
Hemoglobin: 11.7 g/dL — ABNORMAL LOW (ref 12.0–15.0)
MCH: 26.9 pg (ref 26.0–34.0)
MCHC: 33.1 g/dL (ref 30.0–36.0)
MCV: 81.1 fL (ref 80.0–100.0)
Platelets: 226 10*3/uL (ref 150–400)
RBC: 4.35 MIL/uL (ref 3.87–5.11)
RDW: 17.2 % — ABNORMAL HIGH (ref 11.5–15.5)
WBC: 18.1 10*3/uL — ABNORMAL HIGH (ref 4.0–10.5)
nRBC: 0 % (ref 0.0–0.2)

## 2023-05-26 LAB — GLUCOSE, CAPILLARY: Glucose-Capillary: 130 mg/dL — ABNORMAL HIGH (ref 70–99)

## 2023-05-26 MED ORDER — SODIUM CHLORIDE 0.9% FLUSH: 3.0000 mL | Freq: Two times a day (BID) | INTRAVENOUS | Status: DC

## 2023-05-26 MED ORDER — SODIUM CHLORIDE 0.9% FLUSH
10.0000 mL | Freq: Two times a day (BID) | INTRAVENOUS | Status: DC
Start: 2023-05-26 — End: 2023-05-27

## 2023-05-26 MED ORDER — SENNOSIDES-DOCUSATE SODIUM 8.6-50 MG PO TABS
2.0000 | ORAL_TABLET | ORAL | Status: DC
  Administered 2023-05-26 – 2023-05-27 (×2): 2 via ORAL
  Filled 2023-05-26 (×2): qty 2

## 2023-05-26 MED ORDER — TETANUS-DIPHTH-ACELL PERTUSSIS 5-2.5-18.5 LF-MCG/0.5 IM SUSY: 0.5000 mL | PREFILLED_SYRINGE | Freq: Once | INTRAMUSCULAR | Status: DC

## 2023-05-26 MED ORDER — SODIUM CHLORIDE 0.9% FLUSH: 3.0000 mL | INTRAVENOUS | Status: DC | PRN

## 2023-05-26 MED ORDER — ONDANSETRON HCL 4 MG/2ML IJ SOLN: 4.0000 mg | INTRAMUSCULAR | Status: DC | PRN

## 2023-05-26 MED ORDER — DIPHENHYDRAMINE HCL 25 MG PO CAPS: 25.0000 mg | ORAL_CAPSULE | Freq: Four times a day (QID) | ORAL | Status: DC | PRN

## 2023-05-26 MED ORDER — SIMETHICONE 80 MG PO CHEW: 80.0000 mg | CHEWABLE_TABLET | ORAL | Status: DC | PRN

## 2023-05-26 MED ORDER — ZOLPIDEM TARTRATE 5 MG PO TABS: 5.0000 mg | ORAL_TABLET | Freq: Every evening | ORAL | Status: DC | PRN

## 2023-05-26 MED ORDER — ENOXAPARIN SODIUM 100 MG/ML IJ SOSY
180.0000 mg | PREFILLED_SYRINGE | INTRAMUSCULAR | Status: DC
  Administered 2023-05-26: 180 mg via SUBCUTANEOUS
  Filled 2023-05-26: qty 1.8

## 2023-05-26 MED ORDER — PRENATAL MULTIVITAMIN CH
1.0000 | ORAL_TABLET | Freq: Every day | ORAL | Status: DC
  Filled 2023-05-26 (×2): qty 1

## 2023-05-26 MED ORDER — WITCH HAZEL-GLYCERIN EX PADS: 1.0000 | MEDICATED_PAD | CUTANEOUS | Status: DC | PRN

## 2023-05-26 MED ORDER — MEASLES, MUMPS & RUBELLA VAC IJ SOLR: 0.5000 mL | Freq: Once | INTRAMUSCULAR | Status: DC

## 2023-05-26 MED ORDER — ONDANSETRON HCL 4 MG PO TABS: 4.0000 mg | ORAL_TABLET | ORAL | Status: DC | PRN

## 2023-05-26 MED ORDER — DIBUCAINE (PERIANAL) 1 % EX OINT: 1.0000 | TOPICAL_OINTMENT | CUTANEOUS | Status: DC | PRN

## 2023-05-26 MED ORDER — IBUPROFEN 600 MG PO TABS
600.0000 mg | ORAL_TABLET | Freq: Four times a day (QID) | ORAL | Status: DC
  Administered 2023-05-26 – 2023-05-27 (×7): 600 mg via ORAL
  Filled 2023-05-26 (×8): qty 1

## 2023-05-26 MED ORDER — BENZOCAINE-MENTHOL 20-0.5 % EX AERO
1.0000 | INHALATION_SPRAY | CUTANEOUS | Status: DC | PRN
  Filled 2023-05-26: qty 56

## 2023-05-26 MED ORDER — ACETAMINOPHEN 325 MG PO TABS: 650.0000 mg | ORAL_TABLET | ORAL | Status: DC | PRN

## 2023-05-26 MED ORDER — COCONUT OIL OIL: 1.0000 | TOPICAL_OIL | Status: DC | PRN

## 2023-05-26 NOTE — Anesthesia Postprocedure Evaluation (Signed)
Anesthesia Post Note  Patient: Carmen Rivers  Procedure(s) Performed: AN AD HOC LABOR EPIDURAL     Patient location during evaluation: Mother Baby Anesthesia Type: Epidural Level of consciousness: awake, oriented and awake and alert Pain management: pain level controlled Vital Signs Assessment: post-procedure vital signs reviewed and stable Respiratory status: spontaneous breathing, respiratory function stable and nonlabored ventilation Cardiovascular status: stable Postop Assessment: no headache, adequate PO intake, able to ambulate, patient able to bend at knees and no apparent nausea or vomiting Anesthetic complications: no   No notable events documented.  Last Vitals:  Vitals:   05/26/23 0441 05/26/23 0900  BP: 130/76 116/72  Pulse: (!) 106 81  Resp: 16   Temp: 36.5 C   SpO2: 98% 99%    Last Pain:  Vitals:   05/26/23 0900  TempSrc: Oral  PainSc:    Pain Goal:                   Anays Detore

## 2023-05-26 NOTE — Lactation Note (Signed)
This note was copied from a baby's chart. Lactation Consultation Note  Patient Name: Carmen Rivers [id number] Date: 05/26/2023 Age:25 hours Reason for consult: Initial assessment;Maternal endocrine disorder;Primapara;Early term 65-38.6wks RN assisted putting baby on the breast. Baby had great latch and BF great. Could see baby swallowing, noted milk transfer from breast. Baby appeared to have a deep latch and taking mom's nipples well. Mom had nipple piercing and colostrum coming from piercing holes. Newborn feeding habits, behavior, STS, I&O, positioning, support, props reviewed. Mom encouraged to feed baby 8-12 times/24 hours and with feeding cues.  Praised mom for a great feeding. Answered the few questions mom had. Mom is very tired. Encouraged to call for assistance as needed.  Maternal Data Has patient been taught Hand Expression?: Yes Does the patient have breastfeeding experience prior to this delivery?: No  Feeding    LATCH Score Latch: Grasps breast easily, tongue down, lips flanged, rhythmical sucking.  Audible Swallowing: Spontaneous and intermittent  Type of Nipple: Everted at rest and after stimulation  Comfort (Breast/Nipple): Soft / non-tender  Hold (Positioning): Assistance needed to correctly position infant at breast and maintain latch.  LATCH Score: 9   Lactation Tools Discussed/Used    Interventions Interventions: Breast feeding basics reviewed;Hand express;Education;LC Services brochure  Discharge    Consult Status Consult Status: Follow-up Date: 05/27/23 Follow-up type: In-patient    Charyl Dancer 05/26/2023, 4:33 AM

## 2023-05-26 NOTE — Progress Notes (Signed)
Post Partum Day #1 Subjective: no complaints, up ad lib, and tolerating PO; breastfeeding going well; condoms for contraception; review of vitals shows some elevated BP values during her 2h PP recovery after delivery, but nl values since  Objective: Blood pressure 130/76, pulse (!) 106, temperature 97.7 F (36.5 C), temperature source Oral, resp. rate 16, height 5\' 4"  (1.626 m), weight 120.2 kg, last menstrual period 08/26/2022, SpO2 98%, unknown if currently breastfeeding.  Physical Exam:  General: alert, cooperative, and no distress Lochia: appropriate Uterine Fundus: firm DVT Evaluation: No evidence of DVT seen on physical exam.  Recent Labs    05/24/23 1915 05/26/23 0359  HGB 12.3 11.7*  HCT 37.1 35.3*    Assessment/Plan: Plan for discharge tomorrow Continue to watch BPs   LOS: 2 days   Carmen Rivers, CNM 05/26/2023, 8:56 AM

## 2023-05-27 ENCOUNTER — Other Ambulatory Visit (HOSPITAL_COMMUNITY): Payer: Self-pay

## 2023-05-27 ENCOUNTER — Encounter: Payer: Self-pay | Admitting: Oncology

## 2023-05-27 MED ORDER — IBUPROFEN 600 MG PO TABS
600.0000 mg | ORAL_TABLET | Freq: Four times a day (QID) | ORAL | 0 refills | Status: AC
  Filled 2023-05-27: qty 30, 8d supply, fill #0

## 2023-05-27 MED ORDER — ENOXAPARIN SODIUM 100 MG/ML IJ SOSY
1.5000 mg/kg | PREFILLED_SYRINGE | INTRAMUSCULAR | 0 refills | Status: AC
  Filled 2023-05-27: qty 60, 30d supply, fill #0

## 2023-05-27 MED ORDER — ACETAMINOPHEN 325 MG PO TABS
650.0000 mg | ORAL_TABLET | ORAL | 1 refills | Status: AC | PRN
  Filled 2023-05-27: qty 60, 5d supply, fill #0

## 2023-05-27 MED ORDER — ENOXAPARIN SODIUM 100 MG/ML IJ SOSY
1.5000 mg/kg | PREFILLED_SYRINGE | INTRAMUSCULAR | Status: DC
  Filled 2023-05-27: qty 1.75

## 2023-05-27 NOTE — Progress Notes (Deleted)
Carmen Rivers

## 2023-05-27 NOTE — Lactation Note (Signed)
This note was copied from a baby's chart. Lactation Consultation Note  Patient Name: Carmen Rivers [id number] Date: 05/27/2023 Age:26 hours Reason for consult: Follow-up assessment;Primapara;1st time breastfeeding;Early term 37-38.6wks;Maternal endocrine disorder  P1- MOB reports that infant nurses often and does a great job. MOB also reports that she is experiencing some nipple pain, but she does not believe it is due to infant's latch. LC encouraged MOB to follow up with her local WIC in [facility] to check the latch. MOB agreed and asked for a referral to be sent.  LC reviewed feeding infant on cue 8-12x in 24 hrs, not allowing infant to go over 3 hrs without a feeding, CDC milk storage guidelines, LC services handout and engorgement/breast care. LC encouraged MOB to call for further assistance as needed.  Maternal Data Does the patient have breastfeeding experience prior to this delivery?: No  Feeding Mother's Current Feeding Choice: Breast Milk  Lactation Tools Discussed/Used Tools: Flanges Flange Size: 27 Pump Education: Milk Storage  Interventions Interventions: Breast feeding basics reviewed;Education;LC Services brochure  Discharge Discharge Education: Engorgement and breast care;Warning signs for feeding baby;Outpatient recommendation WIC Program: Yes  Consult Status Consult Status: Complete Date: 05/27/23    Dema Severin BS, IBCLC 05/27/2023, 4:39 PM

## 2023-05-30 ENCOUNTER — Other Ambulatory Visit (HOSPITAL_COMMUNITY): Payer: Self-pay

## 2023-06-11 ENCOUNTER — Telehealth (HOSPITAL_COMMUNITY): Payer: Self-pay

## 2023-06-11 NOTE — Telephone Encounter (Signed)
06/11/2023 1139  Name: Carmen Rivers MRN: [id number] DOB: 14-Dec-1995  Reason for Call:  Transition of Care Hospital Discharge Call  Contact Status: Patient Contact Status: Message  Language assistant needed: Interpreter Mode: Interpreter Not Needed        Follow-Up Questions:    Inocente Salles Postnatal Depression Scale:  In the Past 7 Days:    PHQ2-9 Depression Scale:     Discharge Follow-up:    Post-discharge interventions: NA  Signature  Signe Colt

## 2023-06-11 NOTE — Telephone Encounter (Signed)
06/11/2023  Name: Carmen Rivers MRN: [id number] DOB: December 26, 1995  Reason for Call:  Transition of Care Hospital Discharge Call  Contact Status: Patient Contact Status: Complete Returning patient's call.  Language assistant needed:          Follow-Up Questions: Do You Have Any Concerns About Your Health As You Heal From Delivery?: No Do You Have Any Concerns About Your Infants Health?:  (Patient verbalizes concern about a white coating on baby's tongue. Patient is wondering if this could be thrush. RN told patient to reach out to her pediatrician with her concern and they can assess. Patient has no other questions or concerns.)  Edinburgh Postnatal Depression Scale:  In the Past 7 Days:    PHQ2-9 Depression Scale:     Discharge Follow-up: Edinburgh score requires follow up?:  (Patient declines EPDS today. Patient states that she is doing fine emotionally. RN told patient to reach out to her OB with any feelings of increased anxiety, worry, sadness, depression, or not feeling like herself. Patient verbalizes understanding.) Patient was advised of the following resources:: Breastfeeding Support Group, Support Group Patient referred to:: Peds  Post-discharge interventions: Reviewed Newborn Safe Sleep Practices  Signature  Signe Colt

## 2023-07-03 ENCOUNTER — Emergency Department (HOSPITAL_COMMUNITY)
Admission: EM | Admit: 2023-07-03 | Discharge: 2023-07-03 | Disposition: A | Discharge status: Home / Self Care | Payer: BC Managed Care – PPO | Attending: Emergency Medicine | Admitting: Emergency Medicine

## 2023-07-03 ENCOUNTER — Encounter (HOSPITAL_COMMUNITY): Payer: Self-pay | Admitting: Emergency Medicine

## 2023-07-03 ENCOUNTER — Other Ambulatory Visit: Payer: Self-pay

## 2023-07-03 DIAGNOSIS — L02412 Cutaneous abscess of left axilla: Secondary | ICD-10-CM | POA: Diagnosis present

## 2023-07-03 DIAGNOSIS — Z7951 Long term (current) use of inhaled steroids: Secondary | ICD-10-CM | POA: Insufficient documentation

## 2023-07-03 DIAGNOSIS — J45909 Unspecified asthma, uncomplicated: Secondary | ICD-10-CM | POA: Diagnosis not present

## 2023-07-03 MED ORDER — OXYCODONE-ACETAMINOPHEN 5-325 MG PO TABS
1.0000 | ORAL_TABLET | ORAL | Status: DC | PRN
  Administered 2023-07-03: 1 via ORAL
  Filled 2023-07-03: qty 1

## 2023-07-03 MED ORDER — CLINDAMYCIN HCL 300 MG PO CAPS: 300.0000 mg | ORAL_CAPSULE | Freq: Three times a day (TID) | ORAL | 0 refills | Status: AC

## 2023-07-03 MED ORDER — LIDOCAINE-EPINEPHRINE (PF) 2 %-1:200000 IJ SOLN
10.0000 mL | Freq: Once | INTRAMUSCULAR | Status: AC
  Administered 2023-07-03: 10 mL
  Filled 2023-07-03: qty 20

## 2023-07-03 NOTE — ED Triage Notes (Signed)
Pt reports abscess that developed to left underarm since Thanksgiving. Pt reports last not swelling area got larger overnight. Pt reports subjective fever at home. Area is very wsollen and appears grossly infected. Pt tearful in triage.

## 2023-07-03 NOTE — Discharge Instructions (Signed)
The antibiotic should be safe with your baby.  No change to your breast-feeding.  Follow-up with your dermatologist about potential medication adjustments.  The packing can come out in around 3 days.

## 2023-07-03 NOTE — ED Provider Notes (Signed)
[place] EMERGENCY DEPARTMENT AT [facility] Provider Note   CSN: [id number] Arrival date & time: 07/03/23  1437     History  Chief Complaint  Patient presents with   Abscess    Carmen Rivers is a 27 y.o. female.   Abscess Patient presents with left axillary abscess.  Has had a history of same.  Has reported hidradenitis.  Has seen dermatology.  Due to start on Humira but has not started yet.  Over the last few days more swelling.  Had some drainage.  No fevers.  Has had previous abscesses on the other side but that was around a year ago.    Past Medical History:  Diagnosis Date   Anemia    Asthma    Gastritis    GERD (gastroesophageal reflux disease)    Gestational diabetes    Reactive airway disease    Seasonal allergies    Seizures (HCC)     Home Medications Prior to Admission medications   Medication Sig Start Date End Date Taking? Authorizing Provider  clindamycin (CLEOCIN) 300 MG capsule Take 1 capsule (300 mg total) by mouth 3 (three) times daily. 07/03/23  Yes Benjiman Core, MD  acetaminophen (TYLENOL) 325 MG tablet Take 2 tablets (650 mg total) by mouth every 4 (four) hours as needed (for pain scale < 4). 05/27/23   Wouk, Wilfred Curtis, MD  albuterol (PROVENTIL HFA;VENTOLIN HFA) 108 (90 Base) MCG/ACT inhaler Inhale 2 puffs into the lungs every 6 (six) hours as needed for wheezing or shortness of breath. 09/24/15   Gouru, Deanna Artis, MD  enoxaparin (LOVENOX) 100 MG/ML injection Inject 1.75 mLs (175 mg total) into the skin daily. 05/27/23 07/08/23  Wouk, Wilfred Curtis, MD  fluticasone-salmeterol (ADVAIR) 250-50 MCG/ACT AEPB Inhale 1 puff into the lungs in the morning and at bedtime.    [provider]  ibuprofen (ADVIL) 600 MG tablet Take 1 tablet (600 mg total) by mouth every 6 (six) hours. 05/27/23   Wouk, Wilfred Curtis, MD  montelukast (SINGULAIR) 10 MG tablet Take 10 mg by mouth daily. 03/18/22   [provider]  Prenatal Vit-Fe  Fumarate-FA (PRENATAL MULTIVITAMIN) TABS tablet Take 1 tablet by mouth daily at 12 noon. Patient not taking: Reported on 04/11/2023    [provider]      Allergies    Estrogens and Gramineae pollens    Review of Systems   Review of Systems  Physical Exam Updated Vital Signs BP 124/77   Pulse (!) 104   Temp 99.7 F (37.6 C)   Resp (!) 22   Ht 5\' 4"  (1.626 m)   Wt 101.6 kg   LMP 08/26/2022   SpO2 98%   BMI 38.45 kg/m  Physical Exam Vitals and nursing note reviewed.  Cardiovascular:     Rate and Rhythm: Normal rate.  Skin:    Comments: Abscess left axilla that is fluctuant and approximately 5 cm across.  Some induration.  Mild amount of purulent drainage.  Neurological:     Mental Status: She is alert.     ED Results / Procedures / Treatments   Labs (all labs ordered are listed, but only abnormal results are displayed) Labs Reviewed - No data to display  EKG None  Radiology No results found.  Procedures .Incision and Drainage  Date/Time: 07/03/2023 6:20 PM  Performed by: Benjiman Core, MD Authorized by: Benjiman Core, MD   Consent:    Consent obtained:  Verbal   Consent given by:  Patient  Risks, benefits, and alternatives were discussed: yes     Risks discussed:  Bleeding, incomplete drainage, pain and infection   Alternatives discussed:  No treatment, delayed treatment, alternative treatment, observation and referral Universal protocol:    Patient identity confirmed:  Verbally with patient Location:    Type:  Abscess   Size:  5 cm   Location:  Upper extremity   Upper extremity location: l exilla. Pre-procedure details:    Skin preparation:  Chlorhexidine Sedation:    Sedation type:  None Anesthesia:    Anesthesia method:  Local infiltration   Local anesthetic:  Lidocaine 2% WITH epi Procedure type:    Complexity:  Simple Procedure details:    Ultrasound guidance: no     Needle aspiration: no     Incision types:   Elliptical   Incision depth:  Subcutaneous   Wound management:  Probed and deloculated   Drainage:  Purulent   Drainage amount:  Copious   Wound treatment:  Drain placed   Packing materials:  1/4 in iodoform gauze Post-procedure details:    Procedure completion:  Tolerated well, no immediate complications     Medications Ordered in ED Medications  oxyCODONE-acetaminophen (PERCOCET/ROXICET) 5-325 MG per tablet 1 tablet (1 tablet Oral Given 07/03/23 1547)  lidocaine-EPINEPHrine (XYLOCAINE W/EPI) 2 %-1:200000 (PF) injection 10 mL (has no administration in time range)    ED Course/ Medical Decision Making/ A&P                                 Medical Decision Making Risk Prescription drug management.   Patient with hidradenitis with likely abscess on left axilla.  Well-appearing.  Afebrile.  Abscess drained with large amount of purulence.  Packed.  Will give antibiotics.  Discussed with Christiane Ha, the pharmacist and will give clindamycin since patient is breast-feeding.  Can follow-up with her dermatologist.  Will discharge home.  It is not important.         Final Clinical Impression(s) / ED Diagnoses Final diagnoses:  Abscess of left axilla    Rx / DC Orders ED Discharge Orders          Ordered    clindamycin (CLEOCIN) 300 MG capsule  3 times daily        07/03/23 1826              Benjiman Core, MD 07/03/23 [phone]

## 2023-07-03 NOTE — ED Provider Triage Note (Signed)
Emergency Medicine Provider Triage Evaluation Note  Carmen Rivers , a 27 y.o. female  was evaluated in triage.  Pt complains of abscess in left armpit. Hx of HS  Review of Systems  Positive: Abscess Negative: fever  Physical Exam  BP 124/77   Pulse (!) 104   Temp 99.7 F (37.6 C)   Resp (!) 22   Ht 5\' 4"  (1.626 m)   Wt 101.6 kg   LMP 08/26/2022   SpO2 98%   BMI 38.45 kg/m  Gen:   Awake, no distress   Resp:  Normal effort  MSK:   Moves extremities without difficulty  Other:    Medical Decision Making  Medically screening exam initiated at 3:44 PM.  Appropriate orders placed.  Minerva Areola was informed that the remainder of the evaluation will be completed by another provider, this initial triage assessment does not replace that evaluation, and the importance of remaining in the ED until their evaluation is complete.  Labs ordered   Judithann Sheen, [place] 07/03/23 1546

## 2023-07-25 ENCOUNTER — Encounter: Payer: Self-pay | Admitting: Oncology

## 2023-09-05 ENCOUNTER — Inpatient Hospital Stay: Payer: Medicaid Other | Attending: Oncology

## 2023-09-05 ENCOUNTER — Inpatient Hospital Stay: Payer: Medicaid Other

## 2023-09-05 DIAGNOSIS — D500 Iron deficiency anemia secondary to blood loss (chronic): Secondary | ICD-10-CM | POA: Diagnosis present

## 2023-09-05 DIAGNOSIS — Z86711 Personal history of pulmonary embolism: Secondary | ICD-10-CM | POA: Insufficient documentation

## 2023-09-05 DIAGNOSIS — N920 Excessive and frequent menstruation with regular cycle: Secondary | ICD-10-CM | POA: Insufficient documentation

## 2023-09-05 DIAGNOSIS — Z8041 Family history of malignant neoplasm of ovary: Secondary | ICD-10-CM | POA: Insufficient documentation

## 2023-09-05 DIAGNOSIS — Z87891 Personal history of nicotine dependence: Secondary | ICD-10-CM | POA: Insufficient documentation

## 2023-09-05 LAB — CBC WITH DIFFERENTIAL (CANCER CENTER ONLY)
Abs Immature Granulocytes: 0.02 10*3/uL (ref 0.00–0.07)
Basophils Absolute: 0 10*3/uL (ref 0.0–0.1)
Basophils Relative: 0 %
Eosinophils Absolute: 0.1 10*3/uL (ref 0.0–0.5)
Eosinophils Relative: 1 %
HCT: 40.9 % (ref 36.0–46.0)
Hemoglobin: 13.7 g/dL (ref 12.0–15.0)
Immature Granulocytes: 0 %
Lymphocytes Relative: 44 %
Lymphs Abs: 3.9 10*3/uL (ref 0.7–4.0)
MCH: 28.2 pg (ref 26.0–34.0)
MCHC: 33.5 g/dL (ref 30.0–36.0)
MCV: 84.3 fL (ref 80.0–100.0)
Monocytes Absolute: 0.5 10*3/uL (ref 0.1–1.0)
Monocytes Relative: 5 %
Neutro Abs: 4.5 10*3/uL (ref 1.7–7.7)
Neutrophils Relative %: 50 %
Platelet Count: 277 10*3/uL (ref 150–400)
RBC: 4.85 MIL/uL (ref 3.87–5.11)
RDW: 13.2 % (ref 11.5–15.5)
WBC Count: 9 10*3/uL (ref 4.0–10.5)
nRBC: 0 % (ref 0.0–0.2)

## 2023-09-05 LAB — RETIC PANEL
Immature Retic Fract: 8.7 % (ref 2.3–15.9)
RBC.: 4.79 MIL/uL (ref 3.87–5.11)
Retic Count, Absolute: 77.1 10*3/uL (ref 19.0–186.0)
Retic Ct Pct: 1.6 % (ref 0.4–3.1)
Reticulocyte Hemoglobin: 31.6 pg (ref >27.9)

## 2023-09-05 LAB — FERRITIN: Ferritin: 24 ng/mL (ref 11–307)

## 2023-09-05 LAB — IRON AND TIBC
Iron: 55 ug/dL (ref 28–170)
Saturation Ratios: 15 % (ref 10.4–31.8)
TIBC: 377 ug/dL (ref 250–450)
UIBC: 322 ug/dL

## 2023-09-08 ENCOUNTER — Encounter: Payer: Self-pay | Admitting: Oncology

## 2023-09-08 ENCOUNTER — Ambulatory Visit: Payer: BC Managed Care – PPO | Admitting: Oncology

## 2023-09-08 ENCOUNTER — Ambulatory Visit: Payer: BC Managed Care – PPO

## 2023-09-08 ENCOUNTER — Inpatient Hospital Stay (HOSPITAL_BASED_OUTPATIENT_CLINIC_OR_DEPARTMENT_OTHER): Payer: Medicaid Other | Admitting: Oncology

## 2023-09-08 ENCOUNTER — Inpatient Hospital Stay: Payer: Medicaid Other

## 2023-09-08 VITALS — BP 123/82 | HR 75 | Temp 97.4°F | Resp 18 | Wt 237.2 lb

## 2023-09-08 DIAGNOSIS — D500 Iron deficiency anemia secondary to blood loss (chronic): Secondary | ICD-10-CM | POA: Diagnosis not present

## 2023-09-08 NOTE — Assessment & Plan Note (Addendum)
Labs are reviewed and discussed with patient. Lab Results  Component Value Date   HGB 13.7 09/05/2023   TIBC 377 09/05/2023   IRONPCTSAT 15 09/05/2023   FERRITIN 24 09/05/2023   Normalization of hemoglobin and iron panel.  I will hold off IV Venofer treatments.

## 2023-09-08 NOTE — Progress Notes (Signed)
Hematology/Oncology Progress note [phone] [phone]       CHIEF COMPLAINTS/REASON FOR VISIT:  Anemia  ASSESSMENT & PLAN:   IDA (iron deficiency anemia) Labs are reviewed and discussed with patient. Lab Results  Component Value Date   HGB 13.7 09/05/2023   TIBC 377 09/05/2023   IRONPCTSAT 15 09/05/2023   FERRITIN 24 09/05/2023   Normalization of hemoglobin and iron panel.  I will hold off IV Venofer treatments.  Patient is discharged from my clinic. I recommend patient to continue follow up with primary care physician/OB/GYN. Patient may re-establish care in the future if clinically indicated.   All questions were answered. The patient knows to call the clinic with any problems, questions or concerns.  Rickard Patience, MD, PhD [facility] Hematology Oncology 09/08/2023     HISTORY OF PRESENTING ILLNESS:  Carmen Rivers is a  28 y.o.  female with PMH listed below who was referred to me for anemia Reviewed patient's recent labs that was done.  She was found to have abnormal CBC with Hb of 9.5, microcytic.  + heavy menstrual period, + fatigue.  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  She denies over the counter NSAID ingestion.  She has tried different types of oral iron supplementation and she is not able to tolerate due to nausea.   INTERVAL HISTORY Carmen Rivers is a 28 y.o. female who has above history reviewed by me today presents for follow up visit for iron deficiency anemia.  Patient is currently in her third trimester due date is in Nov 2024. She reports feeling well.  No new complaints.  She breast-feeds her baby who is 55 months old. She tolerates IV Venofer treatments previously.   MEDICAL HISTORY:  Past Medical History:   Diagnosis Date   Anemia    Asthma    Gastritis    GERD (gastroesophageal reflux disease)    Gestational diabetes    Reactive airway disease    Seasonal allergies    Seizures (HCC)     SURGICAL HISTORY: Past Surgical History:  Procedure Laterality Date   NO PAST SURGERIES      SOCIAL HISTORY: Social History   Socioeconomic History   Marital status: Single    Spouse name: Natasha Bence   Number of children: 0   Years of education: Not on file   Highest education level: Bachelor's degree (e.g., BA, AB, BS)  Occupational History   Occupation: Runner, broadcasting/film/video    Comment: Freight forwarder  Tobacco Use   Smoking status: Former    Types: E-cigarettes   Smokeless tobacco: Never  Vaping Use   Vaping status: Former  Substance and Sexual Activity   Alcohol use: Not Currently    Alcohol/week: 1.0 standard drink of alcohol    Types: 1 Standard drinks or equivalent per week    Comment: Last ETOH use 07/19/20   Drug use: Not Currently    Types: Marijuana    Comment: Last marijuana use Christmas 07/19/20.   Sexual activity: Not Currently    Partners: Male    Birth control/protection: Condom  Other Topics Concern   Not on file  Social History Narrative   Not on file   Social Drivers of Health   Financial Resource Strain: Not on file  Food Insecurity: No Food Insecurity (05/24/2023)  Hunger Vital Sign    Worried About Running Out of Food in the Last Year: Never true    Ran Out of Food in the Last Year: Never true  Transportation Needs: No Transportation Needs (05/24/2023)   PRAPARE - Administrator, Civil Service (Medical): No    Lack of Transportation (Non-Medical): No  Physical Activity: Not on file  Stress: Not on file  Social Connections: Not on file  Intimate Partner Violence: Not At Risk (05/24/2023)   Humiliation, Afraid, Rape, and Kick questionnaire    Fear of Current or Ex-Partner: No    Emotionally Abused: No    Physically Abused: No    Sexually  Abused: No    FAMILY HISTORY: Family History  Problem Relation Age of Onset   ADD / ADHD Brother    ADD / ADHD Brother    Cancer Maternal Grandmother        Ovarian cancer    ALLERGIES:  is allergic to estrogens and gramineae pollens.  MEDICATIONS:  Current Outpatient Medications  Medication Sig Dispense Refill   acetaminophen (TYLENOL) 325 MG tablet Take 2 tablets (650 mg total) by mouth every 4 (four) hours as needed (for pain scale < 4). 60 tablet 1   albuterol (PROVENTIL HFA;VENTOLIN HFA) 108 (90 Base) MCG/ACT inhaler Inhale 2 puffs into the lungs every 6 (six) hours as needed for wheezing or shortness of breath. 1 Inhaler 0   fluticasone-salmeterol (ADVAIR) 250-50 MCG/ACT AEPB Inhale 1 puff into the lungs in the morning and at bedtime.     clindamycin (CLEOCIN) 300 MG capsule Take 1 capsule (300 mg total) by mouth 3 (three) times daily. (Patient not taking: Reported on 09/08/2023) 21 capsule 0   enoxaparin (LOVENOX) 100 MG/ML injection Inject 1.75 mLs (175 mg total) into the skin daily. (Patient not taking: Reported on 09/08/2023) 73.5 mL 0   ibuprofen (ADVIL) 600 MG tablet Take 1 tablet (600 mg total) by mouth every 6 (six) hours. (Patient not taking: Reported on 09/08/2023) 30 tablet 0   montelukast (SINGULAIR) 10 MG tablet Take 10 mg by mouth daily. (Patient not taking: Reported on 09/08/2023)     Prenatal Vit-Fe Fumarate-FA (PRENATAL MULTIVITAMIN) TABS tablet Take 1 tablet by mouth daily at 12 noon. (Patient not taking: Reported on 09/08/2023)     No current facility-administered medications for this visit.    Review of Systems  Constitutional:  Negative for appetite change, chills, fatigue and fever.  HENT:   Negative for hearing loss and voice change.   Eyes:  Negative for eye problems.  Respiratory:  Negative for chest tightness and cough.   Cardiovascular:  Negative for chest pain.  Gastrointestinal:  Negative for abdominal distention, abdominal pain and blood in stool.   Endocrine: Negative for hot flashes.  Genitourinary:  Negative for difficulty urinating and frequency.   Musculoskeletal:  Negative for arthralgias.  Skin:  Negative for itching and rash.  Neurological:  Negative for extremity weakness.  Hematological:  Negative for adenopathy.  Psychiatric/Behavioral:  Negative for confusion.     PHYSICAL EXAMINATION: ECOG PERFORMANCE STATUS: 1 - Symptomatic but completely ambulatory Vitals:   09/08/23 1448  BP: 123/82  Pulse: 75  Resp: 18  Temp: (!) 97.4 F (36.3 C)   Filed Weights   09/08/23 1448  Weight: 237 lb 3.2 oz (107.6 kg)    Physical Exam Constitutional:      General: She is not in acute distress. HENT:     Head: Normocephalic and atraumatic.  Eyes:     General: No scleral icterus. Cardiovascular:     Rate and Rhythm: Normal rate.  Pulmonary:     Effort: Pulmonary effort is normal. No respiratory distress.  Abdominal:     Comments: Gravid uterus.   Musculoskeletal:        General: Normal range of motion.     Cervical back: Normal range of motion and neck supple.  Skin:    Findings: No erythema or rash.  Neurological:     Mental Status: She is alert and oriented to person, place, and time. Mental status is at baseline.  Psychiatric:        Mood and Affect: Mood normal.      LABORATORY DATA:  I have reviewed the data as listed    Latest Ref Rng & Units 09/05/2023    2:36 PM 05/26/2023    3:59 AM 05/24/2023    7:15 PM  CBC  WBC 4.0 - 10.5 K/uL 9.0  18.1  7.2   Hemoglobin 12.0 - 15.0 g/dL 09.8  11.9  14.7   Hematocrit 36.0 - 46.0 % 40.9  35.3  37.1   Platelets 150 - 400 K/uL 277  226  227       Latest Ref Rng & Units 04/26/2022    3:44 PM 10/05/2019   12:47 AM 09/22/2015    4:49 AM  CMP  Glucose 70 - 99 mg/dL 829  562  99   BUN 6 - 20 mg/dL 6  7  <5   Creatinine 1.30 - 1.00 mg/dL 8.65  7.84  6.96   Sodium 135 - 145 mmol/L 135  136  136   Potassium 3.5 - 5.1 mmol/L 3.6  3.6  3.5   Chloride 98 - 111 mmol/L  105  104  107   CO2 22 - 32 mmol/L 26  20  22    Calcium 8.9 - 10.3 mg/dL 9.0  8.5  7.8   Total Protein 6.5 - 8.1 g/dL 8.5  7.2    Total Bilirubin 0.3 - 1.2 mg/dL 0.4  0.4    Alkaline Phos 38 - 126 U/L 75  67    AST 15 - 41 U/L 16  22    ALT 0 - 44 U/L 11  12        Component Value Date/Time   IRON 55 09/05/2023 1436   TIBC 377 09/05/2023 1436   FERRITIN 24 09/05/2023 1436   IRONPCTSAT 15 09/05/2023 1436     RADIOGRAPHIC STUDIES: I have personally reviewed the radiological images as listed and agreed with the findings in the report. No results found.

## 2023-10-05 ENCOUNTER — Encounter: Payer: Self-pay | Admitting: Oncology

## 2023-11-09 ENCOUNTER — Other Ambulatory Visit: Payer: Self-pay | Admitting: Medical Genetics

## 2023-11-14 ENCOUNTER — Other Ambulatory Visit

## 2024-02-16 NOTE — Progress Notes (Signed)
-------------------------------------------------------------------------------   Attestation signed by Leora Alfonse Kaska, MD at 02/17/24 [phone] I saw and evaluated the patient, participating in the key elements of the service.  I discussed the findings, assessment and plan with the Resident and agree with the Resident's findings and plan as documented in the Resident's note.  I was present for the entirety of procedures taking less than 5 minutes and was present for the key and critical portions and immediately available for the entirety of procedure(s) taking 5 or more minutes.  ALFONSE LULLA LEORA, MD  -------------------------------------------------------------------------------  PROCEDURE NOTE: Complex Unroofing  INDICATION: Hidradenitis suppurativa with persistent tunneling despite medication management  Location: R axilla  Time out taken: Patient Verification with name, medical record number, and date of birth performed.  Site and procedure verified with patient/physician agreement and clinical photograph.  2 Health Care Workers involved in verification.  Consent form:. Implantable device: no            Allergies to Anesthetics: no Intolerance to Epinephrine : no      Anticoagulants: no  Major Medical Problems: Reviewed Time: 10:50am   Personnel present during procedure/verification: EDITH LULLA LEORA, MD Edsel Barry, MD Operating surgeon (s): EDITH LULLA LEORA, MD Edsel Barry, MD  The size and nature of the excision as well as typical scarring were discussed along with, among others, the risks of bleeding, infection, cutaneous dysesthesia, positive margins, and lesion recurrence. Written consent obtained/form signed.  UNROOFING  Anesthesia: lidocaine  1% with epinephrine  was infiltrated at the site with a total of 25 ml injected.  The surgical site was marked, cleansed with chlorhexidine x 2, and draped in a sterile fashion.    A fistula probe and/or forceps was used to  delineate the involved area.  Iris scissor was used to open all detectable sinuses. Complex sequential unroofing and wound contouring with beveling was performed with scissor or blade as appropriate to assure second intention healing.  Hemostasis was obtained in the usual fashion with pressure, AlCl solution, and/or cautery. Tissue was sent for histopathological examination.   Size of wounds: 2 cm x 1 cm  3.5 cm x 1 cm 2 cm x 1 cm  ANTIBIOTICS: None  DRESSING A dry pressure dressing was applied to the wound site over petrolatum.  Wound healing expectations and the specifics of wound cleansing and bandaging were discussed in detail. The patient was given a handout reiterating instructions. We will contact the patient with results when available.    RTC: Return for Next scheduled follow up.

## 2024-03-03 ENCOUNTER — Emergency Department
Admission: EM | Admit: 2024-03-03 | Discharge: 2024-03-03 | Disposition: A | Discharge status: Home / Self Care | Attending: Emergency Medicine | Admitting: Emergency Medicine

## 2024-03-03 ENCOUNTER — Other Ambulatory Visit: Payer: Self-pay

## 2024-03-03 DIAGNOSIS — N644 Mastodynia: Secondary | ICD-10-CM | POA: Diagnosis not present

## 2024-03-03 DIAGNOSIS — O9229 Other disorders of breast associated with pregnancy and the puerperium: Secondary | ICD-10-CM

## 2024-03-03 MED ORDER — DICLOXACILLIN SODIUM 250 MG PO CAPS
500.0000 mg | ORAL_CAPSULE | Freq: Four times a day (QID) | ORAL | 0 refills | Status: AC
Start: 2024-03-03 — End: 2024-03-13

## 2024-03-03 NOTE — ED Provider Notes (Addendum)
 [facility] Provider Note    Event Date/Time   First MD Initiated Contact with Patient 03/03/24 [phone]     (approximate)   History   Breast Discharge   HPI  Carmen Rivers is a 28 y.o. female who comes in with left nipple discharge starting yesterday.  Did report having prior nipples pierced but took them out about a year ago due to being pregnant.  She is reportedly breast-feeding.  Patient reports that this is her first child.  She reports vaginal delivery.  Her child is 9 months.  She reports that her child does have teeth and she is not sure if he could have injured the nipple.  She reports some tenderness on the left nipple and noticing some greenish discharge.  Denies any fevers.  Does report that yesterday she had a little bit of viral symptoms but reports that those have all since resolved.  She reports things are going well.  Denies any SI.  Denies any vaginal bleeding, vaginal discharge.   Physical Exam   Triage Vital Signs: ED Triage Vitals  Encounter Vitals Group     BP 03/03/24 0821 100/67     Girls Systolic BP Percentile --      Girls Diastolic BP Percentile --      Boys Systolic BP Percentile --      Boys Diastolic BP Percentile --      Pulse Rate 03/03/24 0821 (!) 108     Resp 03/03/24 0821 18     Temp 03/03/24 0821 98.2 F (36.8 C)     Temp src --      SpO2 03/03/24 0821 98 %     Weight 03/03/24 0822 240 lb (108.9 kg)     Height 03/03/24 0822 5' 4 (1.626 m)     Head Circumference --      Peak Flow --      Pain Score 03/03/24 0822 7     Pain Loc --      Pain Education --      Exclude from Growth Chart --     Most recent vital signs: Vitals:   03/03/24 0821  BP: 100/67  Pulse: (!) 108  Resp: 18  Temp: 98.2 F (36.8 C)  SpO2: 98%     General: Awake, no distress.  CV:  Good peripheral perfusion.  Resp:  Normal effort.  Abd:  No distention.  Other:  Nipple on the left is slightly enlarged but no fluctuant abscess  noted.  No significant redness.  Some tenderness over the nipple.  Discharge appears mostly normal.  Maybe a little bit yellow-tinged on some of the milk coming out on the medial side of the nipple.   ED Results / Procedures / Treatments   Labs (all labs ordered are listed, but only abnormal results are displayed) Labs Reviewed - No data to display   PROCEDURES:  Critical Care performed: No  Procedures   MEDICATIONS ORDERED IN ED: Medications - No data to display   IMPRESSION / MDM / ASSESSMENT AND PLAN / ED COURSE  I reviewed the triage vital signs and the nursing notes.   Patient's presentation is most consistent with acute, uncomplicated illness.   Patient comes in with nipple pain, some yellowish discharge.  Initially tachycardic but upon recheck without intervention had normal heart rate.  Patient is very well-appearing.  Do not see any signs of severe mastitis.  We discussed that this could be irritation from baby's teeth versus  a mild mastitis that is starting.  Recommended to continue breast-feeding or pumping warm compresses and massage, wearing supportive bra, NSAIDs for discomfort.  If in 24 hours not improving I have also prescribed her an antibiotic to cover for possible mild mastitis.  Did recommend that she follow-up with her primary care doctor/OB to discuss the discharge as sometimes it can be related to underlying issues such as breast cancer but she denies any family history of this and given the fact that she is currently breast-feeding this seems less likely but she will discuss this further with her OB/GYN to see if additional testting such as US /mammogram are needed.   TO note: Upon discharge I did discuss with patient getting a pregnancy test just to ensure patient not currently pregnant again but patient declined stating that she is not sexually active and does not want to have a pregnancy test done as she is currently not been sexually active as she has no  current partner.    FINAL CLINICAL IMPRESSION(S) / ED DIAGNOSES   Final diagnoses:  Postpartum nipple pain  Breast feeding status of mother     Rx / DC Orders   ED Discharge Orders          Ordered    dicloxacillin  (DYNAPEN ) 250 MG capsule  4 times daily        03/03/24 0854             Note:  This document was prepared using Dragon voice recognition software and may include unintentional dictation errors.   Ernest Ronal BRAVO, MD 03/03/24 9143    Ernest Ronal BRAVO, MD 03/03/24 9141    Ernest Ronal BRAVO, MD 03/03/24 0900

## 2024-03-03 NOTE — Discharge Instructions (Addendum)
 Continue to breastfeed or pump, no need to dispose of breastmilk Warm compresses and massage, wearing supportive bra Okay to take ibuprofen  600 every 8 hours as needed for pain for up to one week.  If symptoms are not improving within 24 hours or you start developing redness of the breast, worsening discharge you can trial some antibiotics to cover for possible mastitis.  I do recommend that you call your OB/GYN to get a follow-up appointment to discuss this to see if you need further workup, testing to rule out other conditions as we discussed

## 2024-03-03 NOTE — ED Triage Notes (Signed)
 Pt to ED for left nipple green-like discharge started yesterday. Reports had nipples pierced but took them out about a year ago when pregnant. Currently breastfeeding

## 2024-05-04 ENCOUNTER — Other Ambulatory Visit: Payer: Self-pay | Admitting: Medical Genetics

## 2024-05-04 DIAGNOSIS — Z006 Encounter for examination for normal comparison and control in clinical research program: Secondary | ICD-10-CM
